# Patient Record
Sex: Male | Born: 1935 | Race: White | Hispanic: No | Marital: Married | Smoking: Never smoker
Health system: Southern US, Community
[De-identification: ages and names within clinical notes are randomized; demographics above are authoritative.]

## PROBLEM LIST (undated history)

## (undated) DIAGNOSIS — F329 Major depressive disorder, single episode, unspecified: Secondary | ICD-10-CM

## (undated) DIAGNOSIS — S68119A Complete traumatic metacarpophalangeal amputation of unspecified finger, initial encounter: Secondary | ICD-10-CM

## (undated) DIAGNOSIS — J329 Chronic sinusitis, unspecified: Secondary | ICD-10-CM

## (undated) DIAGNOSIS — K219 Gastro-esophageal reflux disease without esophagitis: Secondary | ICD-10-CM

## (undated) DIAGNOSIS — E785 Hyperlipidemia, unspecified: Secondary | ICD-10-CM

## (undated) DIAGNOSIS — F419 Anxiety disorder, unspecified: Secondary | ICD-10-CM

## (undated) DIAGNOSIS — M545 Low back pain, unspecified: Secondary | ICD-10-CM

## (undated) DIAGNOSIS — N529 Male erectile dysfunction, unspecified: Secondary | ICD-10-CM

## (undated) DIAGNOSIS — E119 Type 2 diabetes mellitus without complications: Secondary | ICD-10-CM

## (undated) DIAGNOSIS — K589 Irritable bowel syndrome without diarrhea: Secondary | ICD-10-CM

## (undated) DIAGNOSIS — F32A Depression, unspecified: Secondary | ICD-10-CM

## (undated) DIAGNOSIS — R5383 Other fatigue: Secondary | ICD-10-CM

## (undated) HISTORY — DX: Hyperlipidemia, unspecified: E78.5

## (undated) HISTORY — DX: Low back pain, unspecified: M54.50

## (undated) HISTORY — DX: Major depressive disorder, single episode, unspecified: F32.9

## (undated) HISTORY — DX: Irritable bowel syndrome, unspecified: K58.9

## (undated) HISTORY — DX: Type 2 diabetes mellitus without complications: E11.9

## (undated) HISTORY — DX: Depression, unspecified: F32.A

## (undated) HISTORY — DX: Chronic sinusitis, unspecified: J32.9

## (undated) HISTORY — DX: Complete traumatic metacarpophalangeal amputation of unspecified finger, initial encounter: S68.119A

## (undated) HISTORY — DX: Other fatigue: R53.83

## (undated) HISTORY — DX: Anxiety disorder, unspecified: F41.9

## (undated) HISTORY — DX: Male erectile dysfunction, unspecified: N52.9

## (undated) HISTORY — PX: NECK SURGERY: SHX720

## (undated) HISTORY — DX: Low back pain: M54.5

---

## 1997-10-22 ENCOUNTER — Emergency Department (HOSPITAL_COMMUNITY): Admission: EM | Admit: 1997-10-22 | Discharge: 1997-10-22 | Payer: Self-pay | Admitting: Emergency Medicine

## 1997-11-13 ENCOUNTER — Encounter: Admission: RE | Admit: 1997-11-13 | Discharge: 1998-02-11 | Payer: Self-pay | Admitting: Unknown Physician Specialty

## 1998-05-03 ENCOUNTER — Encounter: Payer: Self-pay | Admitting: *Deleted

## 1998-05-03 ENCOUNTER — Ambulatory Visit (HOSPITAL_COMMUNITY): Admission: RE | Admit: 1998-05-03 | Discharge: 1998-05-03 | Payer: Self-pay | Admitting: *Deleted

## 1998-07-06 ENCOUNTER — Encounter: Payer: Self-pay | Admitting: Emergency Medicine

## 1998-07-06 ENCOUNTER — Observation Stay (HOSPITAL_COMMUNITY): Admission: EM | Admit: 1998-07-06 | Discharge: 1998-07-07 | Payer: Self-pay | Admitting: Emergency Medicine

## 1998-07-26 ENCOUNTER — Ambulatory Visit (HOSPITAL_COMMUNITY): Admission: RE | Admit: 1998-07-26 | Discharge: 1998-07-26 | Payer: Self-pay | Admitting: Cardiovascular Disease

## 1998-09-04 ENCOUNTER — Emergency Department (HOSPITAL_COMMUNITY): Admission: EM | Admit: 1998-09-04 | Discharge: 1998-09-04 | Payer: Self-pay | Admitting: Emergency Medicine

## 1998-10-04 ENCOUNTER — Emergency Department (HOSPITAL_COMMUNITY): Admission: EM | Admit: 1998-10-04 | Discharge: 1998-10-04 | Payer: Self-pay | Admitting: Emergency Medicine

## 1998-10-05 ENCOUNTER — Inpatient Hospital Stay (HOSPITAL_COMMUNITY): Admission: EM | Admit: 1998-10-05 | Discharge: 1998-10-06 | Payer: Self-pay | Admitting: Emergency Medicine

## 1998-10-05 ENCOUNTER — Encounter: Payer: Self-pay | Admitting: Emergency Medicine

## 1998-10-05 ENCOUNTER — Encounter: Payer: Self-pay | Admitting: Urology

## 1999-09-04 ENCOUNTER — Ambulatory Visit (HOSPITAL_COMMUNITY): Admission: RE | Admit: 1999-09-04 | Discharge: 1999-09-04 | Payer: Self-pay | Admitting: Family Medicine

## 1999-09-04 ENCOUNTER — Encounter: Payer: Self-pay | Admitting: Family Medicine

## 2003-11-02 ENCOUNTER — Encounter: Admission: RE | Admit: 2003-11-02 | Discharge: 2003-11-02 | Payer: Self-pay | Admitting: Gastroenterology

## 2003-12-24 ENCOUNTER — Emergency Department (HOSPITAL_COMMUNITY): Admission: EM | Admit: 2003-12-24 | Discharge: 2003-12-24 | Payer: Self-pay | Admitting: *Deleted

## 2005-10-21 ENCOUNTER — Encounter: Payer: Self-pay | Admitting: Emergency Medicine

## 2005-12-25 ENCOUNTER — Encounter: Admission: RE | Admit: 2005-12-25 | Discharge: 2005-12-25 | Payer: Self-pay | Admitting: Gastroenterology

## 2006-01-01 ENCOUNTER — Ambulatory Visit (HOSPITAL_COMMUNITY): Admission: RE | Admit: 2006-01-01 | Discharge: 2006-01-01 | Payer: Self-pay | Admitting: Gastroenterology

## 2008-10-03 ENCOUNTER — Inpatient Hospital Stay (HOSPITAL_COMMUNITY): Admission: EM | Admit: 2008-10-03 | Discharge: 2008-10-04 | Payer: Self-pay | Admitting: Emergency Medicine

## 2010-10-01 LAB — URINE MICROSCOPIC-ADD ON

## 2010-10-01 LAB — CARDIAC PANEL(CRET KIN+CKTOT+MB+TROPI)
CK, MB: 4.7 ng/mL — ABNORMAL HIGH (ref 0.3–4.0)
CK, MB: 5.8 ng/mL — ABNORMAL HIGH (ref 0.3–4.0)
Relative Index: 1.8 (ref 0.0–2.5)
Total CK: 317 U/L — ABNORMAL HIGH (ref 7–232)
Troponin I: <0.01 ng/mL (ref 0.00–0.06)
Troponin I: <0.01 ng/mL (ref 0.00–0.06)

## 2010-10-01 LAB — URINALYSIS, ROUTINE W REFLEX MICROSCOPIC
Bilirubin Urine: NEGATIVE
Glucose, UA: NEGATIVE mg/dL
Hgb urine dipstick: NEGATIVE
Nitrite: NEGATIVE
Urobilinogen, UA: 0.2 mg/dL (ref 0.0–1.0)

## 2010-10-01 LAB — COMPREHENSIVE METABOLIC PANEL
Albumin: 3.6 g/dL (ref 3.5–5.2)
Alkaline Phosphatase: 61 U/L (ref 39–117)
BUN: 9 mg/dL (ref 6–23)
Chloride: 103 mEq/L (ref 96–112)
Creatinine, Ser: 0.79 mg/dL (ref 0.4–1.5)
GFR calc Af Amer: >60 mL/min (ref >60)
Glucose, Bld: 124 mg/dL — ABNORMAL HIGH (ref 70–99)
Sodium: 135 mEq/L (ref 135–145)

## 2010-10-01 LAB — TROPONIN I: Troponin I: <0.01 ng/mL (ref 0.00–0.06)

## 2010-10-01 LAB — CK TOTAL AND CKMB (NOT AT ARMC)
Relative Index: 3.9 — ABNORMAL HIGH (ref 0.0–2.5)
Total CK: 163 U/L (ref 7–232)

## 2010-10-01 LAB — LIPASE, BLOOD: Lipase: 20 U/L (ref 11–59)

## 2010-10-01 LAB — CBC
MCV: 93 fL (ref 78.0–100.0)
RBC: 5 MIL/uL (ref 4.22–5.81)
RDW: 13 % (ref 11.5–15.5)

## 2010-10-01 LAB — BRAIN NATRIURETIC PEPTIDE: Pro B Natriuretic peptide (BNP): 50 pg/mL (ref 0.0–100.0)

## 2010-10-27 ENCOUNTER — Emergency Department (HOSPITAL_COMMUNITY): Payer: Medicare HMO

## 2010-10-27 ENCOUNTER — Observation Stay (HOSPITAL_COMMUNITY)
Admission: EM | Admit: 2010-10-27 | Discharge: 2010-10-28 | Disposition: A | Discharge status: Home / Self Care | Payer: Medicare HMO | Source: Ambulatory Visit | Attending: Interventional Cardiology | Admitting: Interventional Cardiology

## 2010-10-27 DIAGNOSIS — E785 Hyperlipidemia, unspecified: Secondary | ICD-10-CM | POA: Insufficient documentation

## 2010-10-27 DIAGNOSIS — Z01812 Encounter for preprocedural laboratory examination: Secondary | ICD-10-CM | POA: Insufficient documentation

## 2010-10-27 DIAGNOSIS — R079 Chest pain, unspecified: Principal | ICD-10-CM | POA: Insufficient documentation

## 2010-10-27 DIAGNOSIS — Z01818 Encounter for other preprocedural examination: Secondary | ICD-10-CM | POA: Insufficient documentation

## 2010-10-27 DIAGNOSIS — Z79899 Other long term (current) drug therapy: Secondary | ICD-10-CM | POA: Insufficient documentation

## 2010-10-27 DIAGNOSIS — Z01810 Encounter for preprocedural cardiovascular examination: Secondary | ICD-10-CM | POA: Insufficient documentation

## 2010-10-27 LAB — BASIC METABOLIC PANEL
BUN: 17 mg/dL (ref 6–23)
Chloride: 101 mEq/L (ref 96–112)
GFR calc non Af Amer: >60 mL/min (ref >60)
Potassium: 3.6 mEq/L (ref 3.5–5.1)

## 2010-10-27 LAB — DIFFERENTIAL
Basophils Absolute: 0.1 10*3/uL (ref 0.0–0.1)
Basophils Relative: 1 % (ref 0–1)
Monocytes Absolute: 0.9 10*3/uL (ref 0.1–1.0)
Monocytes Relative: 9 % (ref 3–12)
Neutro Abs: 4.3 10*3/uL (ref 1.7–7.7)

## 2010-10-27 LAB — CBC
HCT: 46.8 % (ref 39.0–52.0)
MCV: 88.8 fL (ref 78.0–100.0)
Platelets: 229 10*3/uL (ref 150–400)
RBC: 5.27 MIL/uL (ref 4.22–5.81)
RDW: 13 % (ref 11.5–15.5)
WBC: 9.2 10*3/uL (ref 4.0–10.5)

## 2010-10-27 LAB — D-DIMER, QUANTITATIVE: D-Dimer, Quant: <0.22 ug/mL-FEU (ref 0.00–0.48)

## 2010-10-27 LAB — POCT CARDIAC MARKERS: Myoglobin, poc: 110 ng/mL (ref 12–200)

## 2010-10-27 LAB — TROPONIN I: Troponin I: <0.3 ng/mL (ref <0.30)

## 2010-10-27 LAB — CK TOTAL AND CKMB (NOT AT ARMC)
CK, MB: 4.1 ng/mL — ABNORMAL HIGH (ref 0.3–4.0)
Total CK: 104 U/L (ref 7–232)

## 2010-10-28 LAB — CARDIAC PANEL(CRET KIN+CKTOT+MB+TROPI)
CK, MB: 3.9 ng/mL (ref 0.3–4.0)
Total CK: 102 U/L (ref 7–232)

## 2010-10-28 LAB — BASIC METABOLIC PANEL
BUN: 15 mg/dL (ref 6–23)
CO2: 27 mEq/L (ref 19–32)
Calcium: 9 mg/dL (ref 8.4–10.5)
Creatinine, Ser: 0.65 mg/dL (ref 0.4–1.5)
GFR calc Af Amer: >60 mL/min (ref >60)
Glucose, Bld: 123 mg/dL — ABNORMAL HIGH (ref 70–99)

## 2010-10-28 LAB — CBC
Hemoglobin: 15.3 g/dL (ref 13.0–17.0)
MCH: 30.1 pg (ref 26.0–34.0)
MCHC: 33.5 g/dL (ref 30.0–36.0)

## 2010-10-30 NOTE — Discharge Summary (Signed)
  NAME:  Ivan Flores, Ivan Flores NO.:  [id number]  MEDICAL RECORD NO.:  [id number]           PATIENT TYPE:  I  LOCATION:  6527                         FACILITY:  [facility]  PHYSICIAN:  Lyn Records, M.D.   DATE OF BIRTH:  Jul 19, 1934  DATE OF ADMISSION:  10/27/2010 DATE OF DISCHARGE:  10/28/2010                              DISCHARGE SUMMARY   REASON FOR ADMISSION:  Left arm, neck, jaw, and upper chest pain.  DISCHARGE DIAGNOSES: 1. Constellation of jaw, neck, left arm, and upper left chest     discomfort of uncertain cause.  Acute coronary syndrome was     excluded and heart catheterization/coronary angiography was normal.     It is likely that the pain is inflammatory and/or musculoskeletal.     It is similar to a episode in 2010. 2. Hyperlipidemia. 3. Gastroesophageal reflux disease. 4. History of Schatzki ring.  PROCEDURES PERFORMED:  Urgent coronary angiography, Dr. Catalina Gravel Oct 27, 2010, which was normal.  DISCHARGE INSTRUCTIONS:  Follow up with primary physician, Dr. Laurann Montana.  He is instructed to call to make the appointment.  MEDICATIONS: 1. Hyoscyamine 0.125 mg under tongue every 6 hours as needed. 2. Lorazepam 0.5 mg three times a day as needed for anxiety. 3. Omeprazole 40 mg daily. 4. Pravastatin 40 mg daily. 5. Vicodin ES as needed for pain every 6 hours.  ACTIVITY:  No restrictions.  CONDITION ON DISCHARGE:  Improved.  FURTHER WORK UP:  We will leave this to the primary team, but may need to consider doing a CT or MR of the neck to rule out cervical disk disease as the cause of the patient's pain.  HISTORY AND PHYSICAL AND HOSPITAL COURSE:  Please see the admitting history and physical.  Because of the presenting symptom, cardiac markers were done serially as well as EKGs.  Because of concern that his symptoms could represent acute coronary syndrome, he underwent urgent catheterization by Dr. Eldridge Dace.  No evidence of coronary artery  disease was identified. Markers remained normal.  On the day following admission, he was felt to be stable and ready for discharge.  No further cardiac workup is planned at this time.     Lyn Records, M.D.     HWS/MEDQ  D:  10/28/2010  T:  10/28/2010  Job:  [id number]  cc:   Stacie Acres. Cliffton Asters, M.D.  Electronically Signed by Verdis Prime M.D. on 10/30/2010 12:27:12 PM

## 2010-10-31 ENCOUNTER — Other Ambulatory Visit: Payer: Self-pay | Admitting: Family Medicine

## 2010-10-31 DIAGNOSIS — M542 Cervicalgia: Secondary | ICD-10-CM

## 2010-10-31 DIAGNOSIS — R200 Anesthesia of skin: Secondary | ICD-10-CM

## 2010-11-05 ENCOUNTER — Ambulatory Visit
Admission: RE | Admit: 2010-11-05 | Discharge: 2010-11-05 | Disposition: A | Discharge status: Home / Self Care | Payer: Medicare HMO | Source: Ambulatory Visit | Attending: Family Medicine | Admitting: Family Medicine

## 2010-11-05 DIAGNOSIS — R200 Anesthesia of skin: Secondary | ICD-10-CM

## 2010-11-05 DIAGNOSIS — M542 Cervicalgia: Secondary | ICD-10-CM

## 2010-11-05 NOTE — Cardiovascular Report (Signed)
NAME:  Ivan Flores, BEWLEY NO.:  [id number]  MEDICAL RECORD NO.:  [id number]           PATIENT TYPE:  I  LOCATION:  6527                         FACILITY:  [facility]  PHYSICIAN:  Corky Crafts, MDDATE OF BIRTH:  Sep 22, 1934  DATE OF PROCEDURE:  10/27/2010 DATE OF DISCHARGE:                           CARDIAC CATHETERIZATION   PRIMARY CARE PHYSICIAN:  Stacie Acres. Cliffton Asters, MD  PRIMARY CARDIOLOGIST:  Lyn Records, MD  PROCEDURE PERFORMED:  Left heart catheterization, left ventriculogram, coronary angiogram.  OPERATOR:  Corky Crafts, MD  INDICATIONS:  Unstable angina.  PROCEDURE NARRATIVE:  The risks and benefits of cardiac catheterization were explained to the patient and informed consent was obtained.  He was brought to the cath lab.  He was prepped and draped in usual sterile fashion.  His right wrist was infiltrated with 1% lidocaine.  A 5-French glide sheath was placed into the right radial artery using modified Seldinger technique.  Left coronary artery angiography was performed using a JL-3.5 catheter.  The catheter was advanced to the vessel ostium under fluoroscopic guidance.  Digital angiography was performed in multiple projections using hand injection of contrast.  The right coronary artery angiography was performed using a Williams right catheter in a similar fashion.  A pigtail catheter was advanced to the ascending aorta under fluoroscopic guidance.  Power injection of contrast was performed in the RAO projection to image the left ventricle and catheter was pulled back under continuous hemodynamic pressure monitoring.  The sheath was then removed and a TR band was used for hemostasis.  FINDINGS:  The left main was a long vessel and appeared widely patent. Left circumflex was a large codominant vessel.  There are mild luminal irregularities.  The OM-1 is medium-sized and widely patent.  OM-2 and OM-3 are small but patent.  There does appear  to be a left PDA which appears widely patent. Left anterior descending is a large vessel which reaches the apex. __________ there is an ostial 25% plaque.  There are minor irregularities in the mid to distal LAD.  The first diagonal is a medium- sized vessel and widely patent.  The second diagonal is a small vessel and patent. The right coronary artery is a medium size codominant vessel.  There are mild luminal irregularities throughout. Left ventriculogram shows normal left ventricular function.  There is no ascending aortic aneurysm.  HEMODYNAMICS:  Left ventricular pressure 132/9 with an LVEDP of 13 mmHg. Aortic pressure 126/57 with a mean aortic pressure of 80 mmHg.  IMPRESSION: 1. No significant coronary artery disease, only mild luminal     irregularities noted. 2. Normal left ventricular function. 3. Normal hemodynamics.  RECOMMENDATIONS:  Continue preventive therapy.  We will investigate other etiologies of his chest pain.  We will send off a D-dimer.  From this test, it does not appear that he has any significant coronary artery disease or any significant ascending aortic pathology.  We will likely watch him overnight.     Corky Crafts, MD     JSV/MEDQ  D:  10/27/2010  T:  10/28/2010  Job:  [id number]  Electronically Signed by Lance Muss MD  on 11/05/2010 01:02:39 PM

## 2010-11-05 NOTE — H&P (Signed)
NAME:  MARIAH, HARN NO.:  [id number]  MEDICAL RECORD NO.:  [id number]           PATIENT TYPE:  I  LOCATION:  6527                         FACILITY:  [facility]  PHYSICIAN:  Corky Crafts, MDDATE OF BIRTH:  30-Mar-1935  DATE OF ADMISSION:  10/27/2010 DATE OF DISCHARGE:                             HISTORY & PHYSICAL   PRIMARY CARE PHYSICIAN:  Stacie Acres. White, MD  PRIMARY CARDIOLOGIST:  Lyn Records, MD  HISTORY OF PRESENT ILLNESS:  The patient is a 76 year old man who has severe shoulder, arm, and hand pain, starting about 8:30 in the morning, then moved to the left chest.  He had a headache.  He denies any nausea, vomiting, or syncope.  He has had some intermittent jaw pain.  He has not been very physically active in the past couple of months, prior to that, he had been doing a lot of yard work including chopping wood, he had no chest discomfort with that.  He does feel that he has been more tired lately.  His son recently had a stroke and is in rehab.  He reports back pain and some left arm pain.  Currently, his gums are hurting.  All the symptoms started before the nitroglycerin was started.  PAST MEDICAL HISTORY: 1. Schatzki ring. 2. GERD. 3. High cholesterol.  PAST SURGICAL HISTORY:  Back surgery, prior esophageal balloon dilatation.  ALLERGIES:  EPHEDRINE and intolerance to IBUPROFEN.  SOCIAL HISTORY:  He quit smoking 5-6 years ago.  He is retired.  He occasionally drinks alcohol.  He does drink coffee daily.  FAMILY HISTORY:  His father had bypass surgery in his 60s.  HOME MEDICATIONS: 1. Vicodin as needed. 2. Pravastatin 80 mg daily. 3. Lorazepam. 4. Hyoscyamine. 5. Omeprazole 40 mg daily.  REVIEW OF SYSTEMS:  Significant for pain in his right index finger.  He wears a right wrist brace because he thinks he may have some carpal tunnel.  He has a lot of sensitivity in his right hand and wrist.  No bleeding problems.  No focal  weakness.  No rash.  Chest discomfort as noted above.  All other systems are negative.  PHYSICAL EXAMINATION:  VITAL SIGNS:  Blood pressure 119/60, pulse 45. GENERAL:  He is awake and alert. HEENT:  Head, normocephalic, atraumatic.  Eyes, extraocular movements intact. NECK:  No JVD.  No bruits. CARDIOVASCULAR:  Regular rate and rhythm.  S1 and S2. LUNGS:  Clear to auscultation bilaterally. ABDOMEN:  Soft, nontender. EXTREMITIES:  Showed 3+ radial pulses bilaterally.  EKG shows normal sinus rhythm and left axis deviation, no ST-segment changes.  Troponin is negative.  Hemoglobin 16.5.  Creatinine 0.82. Cardiolite in April 2010 showed no ischemia with an ejection fraction of 58%.  ASSESSMENT AND PLAN: 1. Some features worrisome for unstable angina in a 76 year old man     with high cholesterol.  He says that he had a cardiac cath several     years ago that showed that he grew a new vein in his heart because     he had a blockage.  I do not have any records of this.  He says  this was done many years ago.  We will start aspirin and heparin.     Risks and benefits of the cardiac cath were explained to the     patient and he is willing to proceed. 2. Pravastatin for hyperlipidemia.Corky Crafts, MD     JSV/MEDQ  D:  10/27/2010  T:  10/28/2010  Job:  [id number]  Electronically Signed by Lance Muss MD on 11/05/2010 01:02:30 PM

## 2010-11-07 NOTE — Discharge Summary (Signed)
NAME:  CANDACE, RAMUS NO.:  [id number]   MEDICAL RECORD NO.:  [id number]          PATIENT TYPE:  INP   LOCATION:  3743                         FACILITY:  [facility]   PHYSICIAN:  Lyn Records, M.D.   DATE OF BIRTH:  [birth year]   DATE OF ADMISSION:  10/03/2008  DATE OF DISCHARGE:  10/04/2008                               DISCHARGE SUMMARY   DISCHARGE DIAGNOSES:  1. Nausea, weakness, arm pain felt to be noncardiac in nature.  2. Depression.  3. Gastroesophageal reflux disease.  4. History of kidney stones.  5. Lumbar surgery.  6. Known coronary artery disease in his family.   HOSPITAL COURSE:  Armstead Heiland is a 76 year old male patient who  was admitted to [facility] with a sudden onset of nausea and  weakness, followed by left arm aching and jaw discomfort.  He was  admitted to the hospital and cardiac enzymes were negative.  We  performed an adenosine Cardiolite, showed no perfusion defects were  present.  At this point, we felt that it was safe for the patient to go  home and did not feel that it was cardiac in nature.   MEDICATIONS:  BuSpar daily, Prevacid daily, multivitamin daily, Flonase  as needed, ibuprofen p.r.n.   The patient was discharged home in stable, but improved condition.  Remain on a low-sodium heart-healthy diet.  Increase activity slowly.  Follow up with Dr. Dellia Cloud, nurse practitioner on October 18, 2008 at 1 p.m.      Guy Franco, P.A.      Lyn Records, M.D.  Electronically Signed    LB/MEDQ  D:  12/13/2008  T:  12/14/2008  Job:  [id number]   cc:   Stacie Acres. Cliffton Asters, M.D.

## 2010-11-07 NOTE — Op Note (Signed)
NAME:  Ivan Flores, Ivan Flores NO.:  [id number]   MEDICAL RECORD NO.:  [id number]          PATIENT TYPE:  AMB   LOCATION:  ENDO                         FACILITY:  [facility]   PHYSICIAN:  Graylin Shiver, M.D.   DATE OF BIRTH:  [birth year]   DATE OF PROCEDURE:  01/01/2006  DATE OF DISCHARGE:  01/01/2006                                 OPERATIVE REPORT   PROCEDURE:  Esophagogastroduodenoscopy with endoscopic balloon dilatation.   INDICATIONS FOR PROCEDURE:  Dysphagia, Schatzki's ring.   Informed consent was obtained after explanation of the risks of bleeding,  infection and perforation.   PREMEDICATION:  Fentanyl 50 mcg IV, Versed 4 mg IV.   PROCEDURE:  With the patient in the left lateral decubitus position, the  Olympus gastroscope was inserted into the oropharynx and passed into the  esophagus.  It was advanced down the esophagus and into the stomach and into  the duodenum.  The second portion and bulb of the duodenum were normal.  The  stomach had normal-appearing mucosa.  There was a small hiatal hernia.  There was a Schatzki's ring noted in the distal esophagus.  An endoscopic  balloon dilator was advanced down the scope.  I inflated the balloon to 15,  then 16.5, then 18 mm.  The balloon was held in place at each level for 1  minute.  The balloon was then deflated.  He tolerated the procedure well  without complications.  There was some heme on the site of the dilation.  The rest of the esophagus looked normal.   IMPRESSION:  Schatzki's ring causing dysphagia.   PLAN:  Observe response to the dilatation.           ______________________________  Graylin Shiver, M.D.     SFG/MEDQ  D:  01/04/2006  T:  01/05/2006  Job:  [id number]

## 2010-12-04 ENCOUNTER — Encounter (HOSPITAL_COMMUNITY)
Admission: RE | Admit: 2010-12-04 | Discharge: 2010-12-04 | Disposition: A | Discharge status: Home / Self Care | Payer: Medicare HMO | Source: Ambulatory Visit | Attending: Neurological Surgery | Admitting: Neurological Surgery

## 2010-12-04 LAB — CBC
MCHC: 36 g/dL (ref 30.0–36.0)
Platelets: 218 10*3/uL (ref 150–400)
RDW: 13 % (ref 11.5–15.5)

## 2010-12-04 LAB — BASIC METABOLIC PANEL
GFR calc Af Amer: >60 mL/min (ref >60)
GFR calc non Af Amer: >60 mL/min (ref >60)
Potassium: 5 mEq/L (ref 3.5–5.1)
Sodium: 138 mEq/L (ref 135–145)

## 2010-12-04 LAB — PROTIME-INR: INR: 0.96 (ref 0.00–1.49)

## 2010-12-04 LAB — DIFFERENTIAL
Basophils Absolute: 0.1 10*3/uL (ref 0.0–0.1)
Basophils Relative: 1 % (ref 0–1)
Eosinophils Absolute: 0.2 10*3/uL (ref 0.0–0.7)
Eosinophils Relative: 3 % (ref 0–5)
Monocytes Absolute: 0.7 10*3/uL (ref 0.1–1.0)
Neutro Abs: 4 10*3/uL (ref 1.7–7.7)

## 2010-12-04 LAB — APTT: aPTT: 33 seconds (ref 24–37)

## 2010-12-09 ENCOUNTER — Inpatient Hospital Stay (HOSPITAL_COMMUNITY)
Admission: RE | Admit: 2010-12-09 | Discharge: 2010-12-10 | DRG: 473 | Disposition: A | Discharge status: Home / Self Care | Payer: Medicare HMO | Source: Ambulatory Visit | Attending: Neurological Surgery | Admitting: Neurological Surgery

## 2010-12-09 ENCOUNTER — Ambulatory Visit (HOSPITAL_COMMUNITY): Payer: Medicare HMO

## 2010-12-09 DIAGNOSIS — F329 Major depressive disorder, single episode, unspecified: Secondary | ICD-10-CM | POA: Diagnosis present

## 2010-12-09 DIAGNOSIS — Z01812 Encounter for preprocedural laboratory examination: Secondary | ICD-10-CM

## 2010-12-09 DIAGNOSIS — M1990 Unspecified osteoarthritis, unspecified site: Secondary | ICD-10-CM | POA: Diagnosis present

## 2010-12-09 DIAGNOSIS — M4712 Other spondylosis with myelopathy, cervical region: Principal | ICD-10-CM | POA: Diagnosis present

## 2010-12-09 DIAGNOSIS — K219 Gastro-esophageal reflux disease without esophagitis: Secondary | ICD-10-CM | POA: Diagnosis present

## 2010-12-09 DIAGNOSIS — F3289 Other specified depressive episodes: Secondary | ICD-10-CM | POA: Diagnosis present

## 2010-12-15 NOTE — Op Note (Signed)
NAME:  Ivan Flores, MOHS NO.:  [id number]  MEDICAL RECORD NO.:  [id number]  LOCATION:  3535                         FACILITY:  [facility]  PHYSICIAN:  Tia Alert, MD     DATE OF BIRTH:  1934/07/30  DATE OF PROCEDURE:  12/09/2010 DATE OF DISCHARGE:                              OPERATIVE REPORT   PREOPERATIVE DIAGNOSIS:  Cervical spinal stenosis and cervical spondylosis, C5-6, C6-7, with early myelopathy and left arm pain.  POSTOPERATIVE DIAGNOSIS:  Cervical spinal stenosis and cervical spondylosis, C5-6, C6-7, with early myelopathy and left arm pain.  PROCEDURE: 1. Decompressive anterior cervical diskectomy, C5-6, C6-C7. 2. Anterior cervical arthrodesis, C5-6, C6-7, utilizing 9-mm PEEK     interbody cages, packed with local autograft and Actifuse putty. 3. Anterior cervical plating, C5-C7, utilizing the DePuy skyline     plate.  SURGEON:  Tia Alert, MD.  ASSISTANT:  Donalee Citrin, MD.  ANESTHESIA:  General endotracheal.  COMPLICATIONS:  None apparent.  INDICATIONS FOR PROCEDURE:  Ivan Flores is a very pleasant 76 year old gentleman who presented with neck pain, radiated into the left arm.  He had some numbness and tingling in the right hand.  He felt like his gait was a little changed.  He had an MRI, which signal change within the spinal cord at C5-6 with stenosis at that level and cord compression. He had right foraminal stenosis at C5-6 and let foraminal stenosis at C6- 7.  He complained of significant left arm pain.  I recommended 2-level ACDF with plating at C5-6 and C6-7 in hopes of improving her pain syndrome.  He understood the risks, the benefits, expected outcome, and wished to proceed.  DESCRIPTION OF PROCEDURE:  The patient was taken to operating room. After induction of adequate generalized endotracheal anesthesia, he was placed in supine position on the operating table.  His right anterior cervical region was prepped with DuraPrep and  draped in usual sterile fashion.  A 5 mL of local anesthesia was injected and a transverse incision was made to the right of midline and carried down to the platysma, which was elevated, opened, and undermined with Metzenbaum scissors.  I then dissected a plane medial to the sternocleidomastoid muscle and internal carotid artery and lateral to the trachea and esophagus to expose C5-6 and C6-7.  Intraoperative fluoroscopy was confirmed my level and then the longus colli muscles were taken down and the shadow line retractors were placed under this to expose C5-6 and C6- 7.  Anterior osteophytes were removed.  The annulus was incised.  The diskectomies were done with pituitary rongeurs and curved curettes.  I then used the high-speed drill to drill the endplates down to the level of the posterior spurs and posterior longitudinal ligament.  The drill shavings were saved in a mucous trap for later arthrodesis.  I brought in the operating microscope and performed the exact same decompression at both levels.  I opened the posterior longitudinal ligament with a nerve hook and then removed, undercutting the vertebral bodies at C5-C6 and C6-C7 by angling the scope up and down.  I was able to undercut the vertebral bodies until the dura was relaxed.  I performed bilateral foraminotomies at C5-6 and  left foraminotomy at C6-7, identified the nerve roots.  I marched until I was distal to the pedicle at each level. The nerve hook were then passed easily along the nerve root until I was distal to the pedicle and also passed easily in the midline.  Once meticulous decompression was complete, I irrigated with saline solution, I dried the surgical bed with Surgifoam and Gelfoam.  I measured interspaces to be 9 mm.  I used corresponding PEEK interbody cages, packed with local autograft and Actifuse putty and tapped these into position at C5-6, C6-7, and used a DePuy spine skyline plate.  I placed two 14-mm  variable angle screws in the bodies of C5, C6, and C7, and then locked these into position.  I then further dried the surgical bed with Gelfoam, Surgifoam, and bipolar cautery.  Once meticulous hemostasis was achieved, I closed the subcuticular tissue with 3-0 Vicryl and closed the skin with Benzoin Steri-Strips.  The drapes were removed.  Sterile dressing was applied.  The patient awakened from general anesthesia and transferred to recovery room in stable condition. At the end of procedure, all sponge, needle, and instrument counts were correct.     Tia Alert, MD     DSJ/MEDQ  D:  12/09/2010  T:  12/10/2010  Job:  [id number]  Electronically Signed by Marikay Alar MD on 12/15/2010 05:47:14 AM

## 2010-12-23 ENCOUNTER — Other Ambulatory Visit: Payer: Self-pay | Admitting: Neurological Surgery

## 2010-12-23 ENCOUNTER — Ambulatory Visit
Admission: RE | Admit: 2010-12-23 | Discharge: 2010-12-23 | Disposition: A | Discharge status: Home / Self Care | Payer: Medicare HMO | Source: Ambulatory Visit | Attending: Neurological Surgery | Admitting: Neurological Surgery

## 2010-12-23 DIAGNOSIS — M4802 Spinal stenosis, cervical region: Secondary | ICD-10-CM

## 2010-12-23 DIAGNOSIS — M79609 Pain in unspecified limb: Secondary | ICD-10-CM

## 2011-01-06 ENCOUNTER — Other Ambulatory Visit: Payer: Self-pay | Admitting: Neurological Surgery

## 2011-01-06 ENCOUNTER — Ambulatory Visit
Admission: RE | Admit: 2011-01-06 | Discharge: 2011-01-06 | Disposition: A | Discharge status: Home / Self Care | Payer: Medicare HMO | Source: Ambulatory Visit | Attending: Neurological Surgery | Admitting: Neurological Surgery

## 2011-01-06 DIAGNOSIS — M79609 Pain in unspecified limb: Secondary | ICD-10-CM

## 2011-01-06 DIAGNOSIS — M542 Cervicalgia: Secondary | ICD-10-CM

## 2011-01-06 DIAGNOSIS — M4712 Other spondylosis with myelopathy, cervical region: Secondary | ICD-10-CM

## 2011-01-06 DIAGNOSIS — M4802 Spinal stenosis, cervical region: Secondary | ICD-10-CM

## 2011-04-13 ENCOUNTER — Other Ambulatory Visit: Payer: Self-pay | Admitting: Neurological Surgery

## 2011-04-13 ENCOUNTER — Ambulatory Visit
Admission: RE | Admit: 2011-04-13 | Discharge: 2011-04-13 | Disposition: A | Discharge status: Home / Self Care | Payer: Medicare HMO | Source: Ambulatory Visit | Attending: Neurological Surgery | Admitting: Neurological Surgery

## 2011-04-13 DIAGNOSIS — M542 Cervicalgia: Secondary | ICD-10-CM

## 2011-04-13 DIAGNOSIS — M4802 Spinal stenosis, cervical region: Secondary | ICD-10-CM

## 2011-04-13 DIAGNOSIS — M4712 Other spondylosis with myelopathy, cervical region: Secondary | ICD-10-CM

## 2011-07-13 ENCOUNTER — Other Ambulatory Visit: Payer: Self-pay | Admitting: Neurological Surgery

## 2011-07-13 ENCOUNTER — Ambulatory Visit
Admission: RE | Admit: 2011-07-13 | Discharge: 2011-07-13 | Disposition: A | Discharge status: Home / Self Care | Payer: Medicare HMO | Source: Ambulatory Visit | Attending: Neurological Surgery | Admitting: Neurological Surgery

## 2011-07-13 DIAGNOSIS — M542 Cervicalgia: Secondary | ICD-10-CM

## 2012-04-13 IMAGING — CR DG CERVICAL SPINE 2 OR 3 VIEWS
2 series · 2 of 2 positions shown · non-contrast
Comparison: December 09, 2010

CLINICAL DATA: Cervical surgery 2 weeks ago; pain between shoulders

CERVICAL SPINE - 2-3 VIEW

[w c-spine lat]
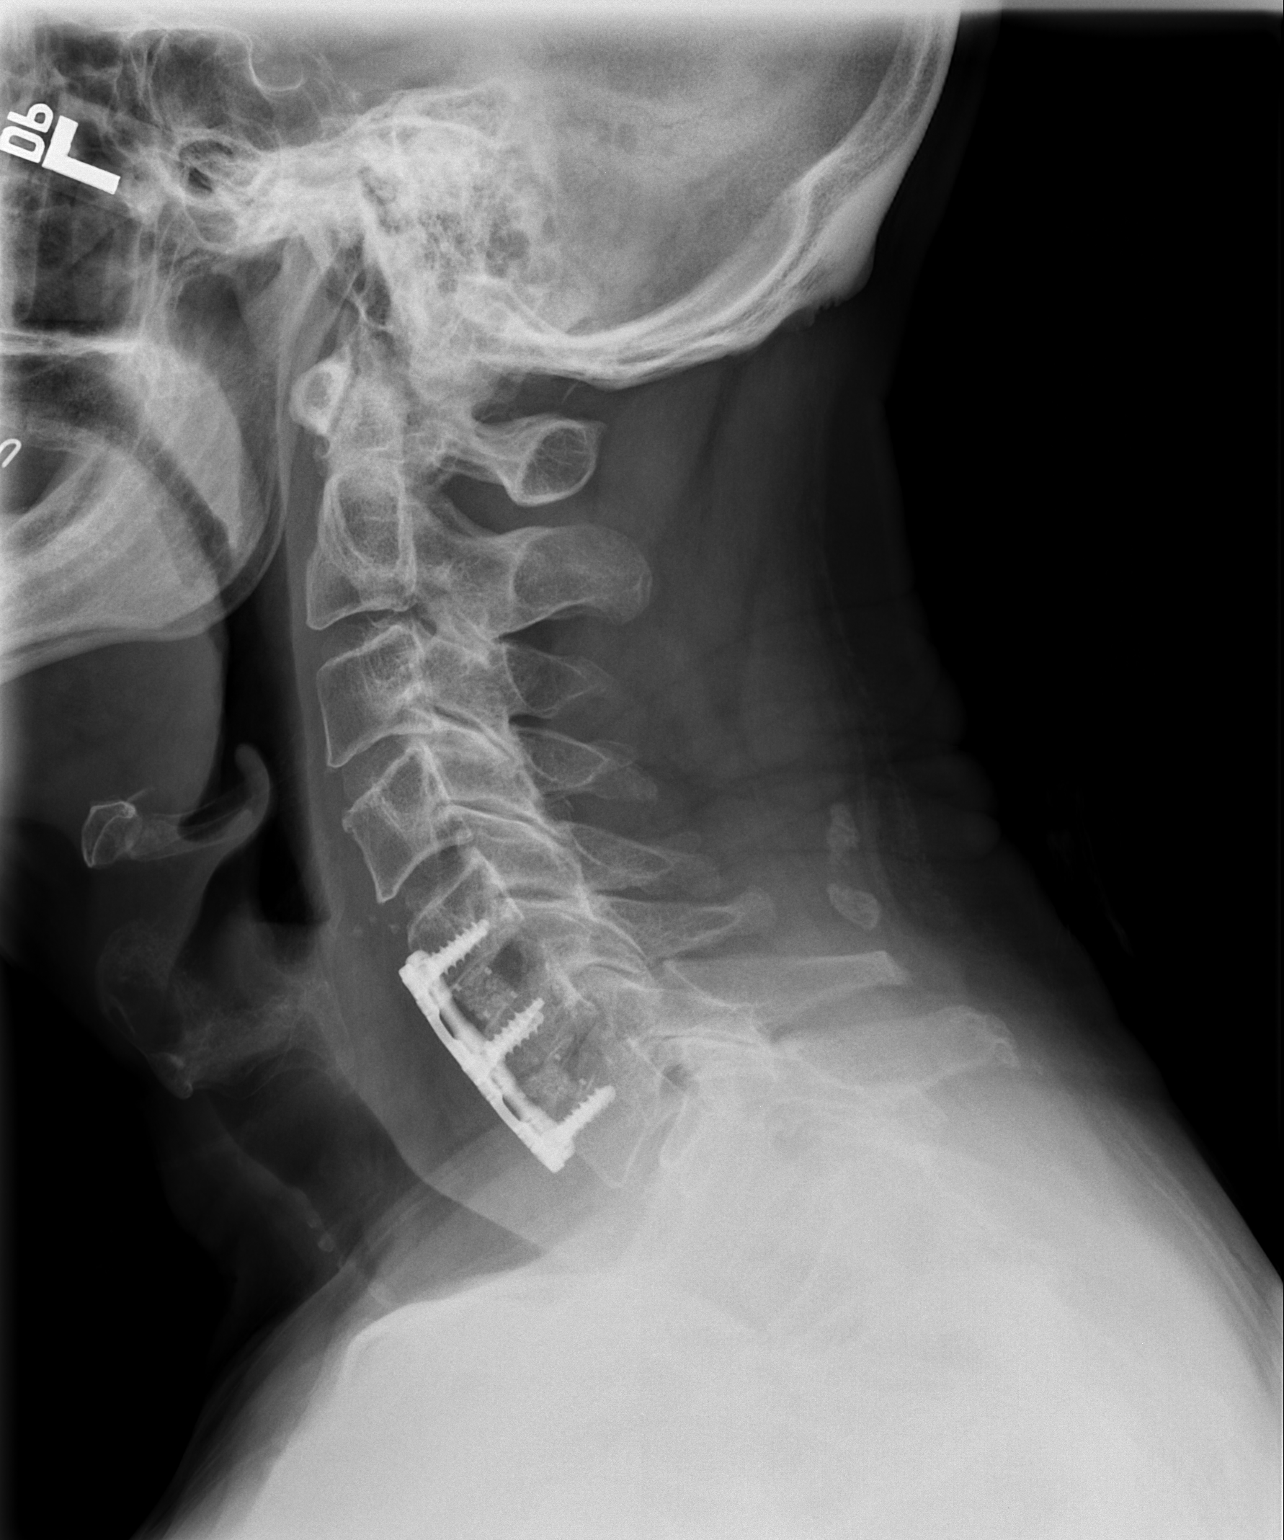

[w c-spine a.p. *]
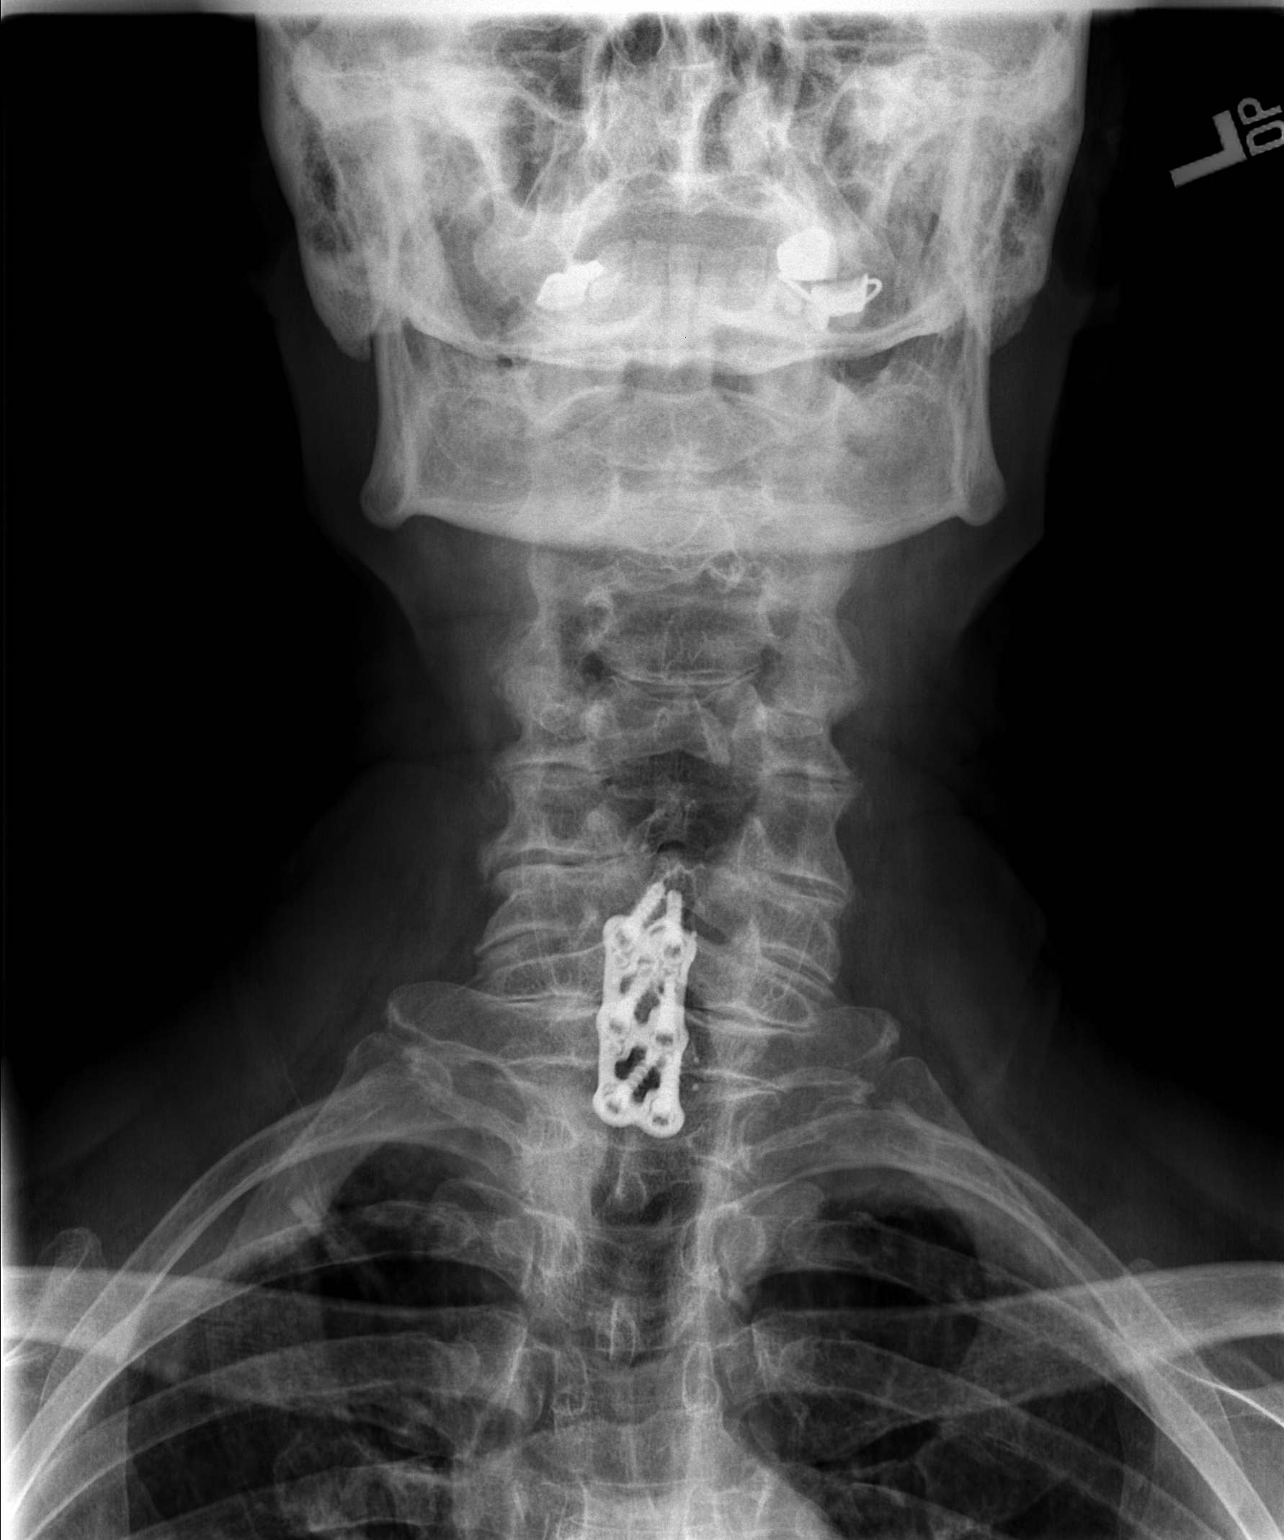

[2 of 2 positions shown; findings below may reference images not displayed]

FINDINGS: There is anterior fusion of C5-C7 with normal lateral
alignment and no peri hardware lucency.  The disc spaces are
maintained.
IMPRESSION: Postoperative changes at C5-C7 with normal lateral alignment and
preservation of the disc spaces.  No evidence of hardware
complication.

## 2012-08-19 ENCOUNTER — Encounter (HOSPITAL_COMMUNITY): Payer: Self-pay | Admitting: Emergency Medicine

## 2012-08-19 ENCOUNTER — Emergency Department (HOSPITAL_COMMUNITY)
Admission: EM | Admit: 2012-08-19 | Discharge: 2012-08-19 | Disposition: A | Discharge status: Home / Self Care | Payer: Medicare HMO | Attending: Emergency Medicine | Admitting: Emergency Medicine

## 2012-08-19 ENCOUNTER — Emergency Department (HOSPITAL_COMMUNITY): Payer: Medicare HMO

## 2012-08-19 DIAGNOSIS — W312XXA Contact with powered woodworking and forming machines, initial encounter: Secondary | ICD-10-CM | POA: Insufficient documentation

## 2012-08-19 DIAGNOSIS — Y929 Unspecified place or not applicable: Secondary | ICD-10-CM | POA: Insufficient documentation

## 2012-08-19 DIAGNOSIS — Z79899 Other long term (current) drug therapy: Secondary | ICD-10-CM | POA: Insufficient documentation

## 2012-08-19 DIAGNOSIS — Y939 Activity, unspecified: Secondary | ICD-10-CM | POA: Insufficient documentation

## 2012-08-19 DIAGNOSIS — Z23 Encounter for immunization: Secondary | ICD-10-CM | POA: Insufficient documentation

## 2012-08-19 DIAGNOSIS — K219 Gastro-esophageal reflux disease without esophagitis: Secondary | ICD-10-CM | POA: Insufficient documentation

## 2012-08-19 DIAGNOSIS — IMO0002 Reserved for concepts with insufficient information to code with codable children: Secondary | ICD-10-CM | POA: Insufficient documentation

## 2012-08-19 HISTORY — DX: Gastro-esophageal reflux disease without esophagitis: K21.9

## 2012-08-19 MED ORDER — CEPHALEXIN 500 MG PO CAPS: 500.0000 mg | ORAL_CAPSULE | Freq: Four times a day (QID) | ORAL | Status: DC

## 2012-08-19 MED ORDER — HYDROMORPHONE HCL PF 1 MG/ML IJ SOLN
1.0000 mg | Freq: Once | INTRAMUSCULAR | Status: AC
  Administered 2012-08-19: 1 mg via INTRAMUSCULAR
  Filled 2012-08-19: qty 1

## 2012-08-19 MED ORDER — HYDROCODONE-ACETAMINOPHEN 5-325 MG PO TABS
1.0000 | ORAL_TABLET | Freq: Once | ORAL | Status: AC
  Administered 2012-08-19: 1 via ORAL
  Filled 2012-08-19: qty 1

## 2012-08-19 MED ORDER — CEPHALEXIN 250 MG PO CAPS
500.0000 mg | ORAL_CAPSULE | Freq: Once | ORAL | Status: AC
  Administered 2012-08-19: 500 mg via ORAL
  Filled 2012-08-19: qty 2

## 2012-08-19 MED ORDER — HYDROCODONE-ACETAMINOPHEN 5-325 MG PO TABS: 1.0000 | ORAL_TABLET | Freq: Three times a day (TID) | ORAL | Status: DC | PRN

## 2012-08-19 MED ORDER — TETANUS-DIPHTH-ACELL PERTUSSIS 5-2.5-18.5 LF-MCG/0.5 IM SUSP
0.5000 mL | Freq: Once | INTRAMUSCULAR | Status: AC
  Administered 2012-08-19: 0.5 mL via INTRAMUSCULAR
  Filled 2012-08-19: qty 0.5

## 2012-08-19 NOTE — ED Notes (Signed)
Pt with right index finger tip amputation today with wood splitter; bleeding controlled

## 2012-08-19 NOTE — ED Notes (Signed)
Dressing changed to right index finger. Petroleum jelly gauze applied, 4x4 gauze wrapped around finger and cling wrapped around index finger and 2nd finger together. Per Dr. Jeraldine Loots.

## 2012-08-19 NOTE — ED Provider Notes (Signed)
History     CSN: [id number]  Arrival date & time 08/19/12  1655   First MD Initiated Contact with Patient 08/19/12 1710      Chief Complaint  Patient presents with  . Hand Injury    (Consider location/radiation/quality/duration/timing/severity/associated sxs/prior treatment) HPI The patient presents immediately after sustaining an injury to his right index finger.  He was using a wood splitter, mechanical.  The device avulsed the distal end of his index finger on the right.  No other injuries.  Since the event there has been pain persistently in the area. No attempts at relief thus far. The patient does not know his tetanus status.  No  Past Medical History  Diagnosis Date  . Acid reflux     History reviewed. No pertinent past surgical history.  History reviewed. No pertinent family history.  History  Substance Use Topics  . Smoking status: Never Smoker   . Smokeless tobacco: Not on file  . Alcohol Use: No      Review of Systems  All other systems reviewed and are negative.    Allergies  Pseudoephedrine  Home Medications   Current Outpatient Rx  Name  Route  Sig  Dispense  Refill  . cetirizine (ZYRTEC) 10 MG tablet   Oral   Take 10 mg by mouth daily.         . fluticasone (FLONASE) 50 MCG/ACT nasal spray   Nasal   Place 2 sprays into the nose daily.         Marland Kitchen ibuprofen (ADVIL,MOTRIN) 200 MG tablet   Oral   Take 600 mg by mouth every 6 (six) hours as needed for pain.         . ranitidine (ZANTAC) 150 MG tablet   Oral   Take 150 mg by mouth 2 (two) times daily.           BP 154/81  Pulse 79  Temp(Src) 98.1 F (36.7 C) (Oral)  Resp 18  SpO2 97%  Physical Exam  Nursing note and vitals reviewed. Constitutional: He appears well-developed and well-nourished.  HENT:  Head: Normocephalic and atraumatic.  Eyes: Conjunctivae are normal. Right eye exhibits no discharge. Left eye exhibits no discharge.  Neck: No tracheal deviation present.    Cardiovascular: Normal rate, regular rhythm and intact distal pulses.   Pulmonary/Chest: Effort normal. No stridor. No respiratory distress.  Musculoskeletal:       Arms: Skin: He is not diaphoretic.    ED Course  Wound repair Date/Time: 08/19/2012 7:16 PM Performed by: Gerhard Munch Authorized by: Gerhard Munch Consent: The procedure was performed in an emergent situation. Risks and benefits: risks, benefits and alternatives were discussed Consent given by: patient Patient understanding: patient states understanding of the procedure being performed Patient consent: the patient's understanding of the procedure matches consent given Procedure consent: procedure consent matches procedure scheduled Relevant documents: relevant documents present and verified Test results: test results available and properly labeled Site marked: the operative site was marked Imaging studies: imaging studies available Required items: required blood products, implants, devices, and special equipment available Patient identity confirmed: verbally with patient and hospital-assigned identification number Time out: Immediately prior to procedure a "time out" was called to verify the correct patient, procedure, equipment, support staff and site/side marked as required. Preparation: Patient was prepped and draped in the usual sterile fashion. Local anesthesia used: no Patient sedated: no Patient tolerance: Patient tolerated the procedure well with no immediate complications. Comments: Indication of the distal index finger  on the right hand, dressed with petroleum based gauze, following irrigation, cleansing.   (including critical care time)  Labs Reviewed - No data to display No results found.   No diagnosis found.  After the initial evaluation I discussed the patient's case with our orthopedist.  The patient we discharged with antibiotics, followup in 3 days.  MDM  This previously well male  presents after the accidental amputation of the distal index finger on the right hand.  The patient is otherwise in good health.  The tissues nonviable.  There is no exposed bone.  After discussion with the orthopedist on call, the patient was discharged to follow up with him in 3 days.  Antibiotics, analgesics provided.  Tetanus updated.      Gerhard Munch, MD 08/19/12 [phone]

## 2012-09-22 ENCOUNTER — Other Ambulatory Visit: Payer: Self-pay | Admitting: Family Medicine

## 2012-09-22 ENCOUNTER — Ambulatory Visit
Admission: RE | Admit: 2012-09-22 | Discharge: 2012-09-22 | Disposition: A | Discharge status: Home / Self Care | Payer: Medicare HMO | Source: Ambulatory Visit | Attending: Family Medicine | Admitting: Family Medicine

## 2012-09-22 DIAGNOSIS — R109 Unspecified abdominal pain: Secondary | ICD-10-CM

## 2012-09-22 DIAGNOSIS — R1032 Left lower quadrant pain: Secondary | ICD-10-CM

## 2013-02-22 ENCOUNTER — Ambulatory Visit
Admission: RE | Admit: 2013-02-22 | Discharge: 2013-02-22 | Disposition: A | Discharge status: Home / Self Care | Payer: Medicare HMO | Source: Ambulatory Visit | Attending: Family Medicine | Admitting: Family Medicine

## 2013-02-22 ENCOUNTER — Other Ambulatory Visit: Payer: Self-pay | Admitting: Family Medicine

## 2013-02-22 DIAGNOSIS — R1310 Dysphagia, unspecified: Secondary | ICD-10-CM

## 2014-11-01 DIAGNOSIS — E1165 Type 2 diabetes mellitus with hyperglycemia: Secondary | ICD-10-CM | POA: Diagnosis not present

## 2014-11-01 DIAGNOSIS — E119 Type 2 diabetes mellitus without complications: Secondary | ICD-10-CM | POA: Diagnosis not present

## 2014-11-01 DIAGNOSIS — R1310 Dysphagia, unspecified: Secondary | ICD-10-CM | POA: Diagnosis not present

## 2014-11-01 DIAGNOSIS — E785 Hyperlipidemia, unspecified: Secondary | ICD-10-CM | POA: Diagnosis not present

## 2014-11-01 DIAGNOSIS — R350 Frequency of micturition: Secondary | ICD-10-CM | POA: Diagnosis not present

## 2014-11-01 DIAGNOSIS — R5383 Other fatigue: Secondary | ICD-10-CM | POA: Diagnosis not present

## 2014-11-01 DIAGNOSIS — K219 Gastro-esophageal reflux disease without esophagitis: Secondary | ICD-10-CM | POA: Diagnosis not present

## 2014-11-01 DIAGNOSIS — R634 Abnormal weight loss: Secondary | ICD-10-CM | POA: Diagnosis not present

## 2014-11-02 DIAGNOSIS — R1310 Dysphagia, unspecified: Secondary | ICD-10-CM | POA: Diagnosis not present

## 2014-11-09 DIAGNOSIS — K580 Irritable bowel syndrome with diarrhea: Secondary | ICD-10-CM | POA: Diagnosis not present

## 2014-11-09 DIAGNOSIS — E1165 Type 2 diabetes mellitus with hyperglycemia: Secondary | ICD-10-CM | POA: Diagnosis not present

## 2014-11-09 DIAGNOSIS — E785 Hyperlipidemia, unspecified: Secondary | ICD-10-CM | POA: Diagnosis not present

## 2014-11-09 DIAGNOSIS — N481 Balanitis: Secondary | ICD-10-CM | POA: Diagnosis not present

## 2014-11-09 DIAGNOSIS — E119 Type 2 diabetes mellitus without complications: Secondary | ICD-10-CM | POA: Diagnosis not present

## 2014-11-09 DIAGNOSIS — B356 Tinea cruris: Secondary | ICD-10-CM | POA: Diagnosis not present

## 2014-11-09 DIAGNOSIS — R109 Unspecified abdominal pain: Secondary | ICD-10-CM | POA: Diagnosis not present

## 2014-11-13 ENCOUNTER — Encounter: Payer: Commercial Managed Care - HMO | Attending: Family Medicine

## 2014-11-13 VITALS — Ht 72.0 in | Wt 200.3 lb

## 2014-11-13 DIAGNOSIS — Z713 Dietary counseling and surveillance: Secondary | ICD-10-CM | POA: Insufficient documentation

## 2014-11-13 DIAGNOSIS — E119 Type 2 diabetes mellitus without complications: Secondary | ICD-10-CM | POA: Diagnosis not present

## 2014-11-13 NOTE — Progress Notes (Signed)

## 2014-11-20 ENCOUNTER — Ambulatory Visit: Payer: Medicare HMO

## 2014-11-20 DIAGNOSIS — K222 Esophageal obstruction: Secondary | ICD-10-CM | POA: Diagnosis not present

## 2014-11-20 DIAGNOSIS — R1310 Dysphagia, unspecified: Secondary | ICD-10-CM | POA: Diagnosis not present

## 2014-11-27 ENCOUNTER — Ambulatory Visit: Payer: Medicare HMO

## 2014-11-29 DIAGNOSIS — E1165 Type 2 diabetes mellitus with hyperglycemia: Secondary | ICD-10-CM | POA: Diagnosis not present

## 2014-11-29 DIAGNOSIS — E785 Hyperlipidemia, unspecified: Secondary | ICD-10-CM | POA: Diagnosis not present

## 2014-11-29 DIAGNOSIS — E119 Type 2 diabetes mellitus without complications: Secondary | ICD-10-CM | POA: Diagnosis not present

## 2014-11-29 DIAGNOSIS — M2662 Arthralgia of temporomandibular joint: Secondary | ICD-10-CM | POA: Diagnosis not present

## 2014-11-29 DIAGNOSIS — Z23 Encounter for immunization: Secondary | ICD-10-CM | POA: Diagnosis not present

## 2014-12-11 ENCOUNTER — Encounter: Payer: Commercial Managed Care - HMO | Attending: Family Medicine

## 2014-12-11 ENCOUNTER — Ambulatory Visit: Payer: Medicare HMO

## 2014-12-11 DIAGNOSIS — E119 Type 2 diabetes mellitus without complications: Secondary | ICD-10-CM | POA: Diagnosis not present

## 2014-12-11 DIAGNOSIS — Z713 Dietary counseling and surveillance: Secondary | ICD-10-CM | POA: Insufficient documentation

## 2014-12-11 DIAGNOSIS — E118 Type 2 diabetes mellitus with unspecified complications: Secondary | ICD-10-CM

## 2014-12-13 NOTE — Progress Notes (Signed)

## 2014-12-18 ENCOUNTER — Ambulatory Visit: Payer: Medicare HMO

## 2015-01-29 DIAGNOSIS — E119 Type 2 diabetes mellitus without complications: Secondary | ICD-10-CM | POA: Diagnosis not present

## 2015-01-29 DIAGNOSIS — E785 Hyperlipidemia, unspecified: Secondary | ICD-10-CM | POA: Diagnosis not present

## 2015-02-22 DIAGNOSIS — Z0100 Encounter for examination of eyes and vision without abnormal findings: Secondary | ICD-10-CM | POA: Diagnosis not present

## 2015-05-02 DIAGNOSIS — K219 Gastro-esophageal reflux disease without esophagitis: Secondary | ICD-10-CM | POA: Diagnosis not present

## 2015-05-02 DIAGNOSIS — E119 Type 2 diabetes mellitus without complications: Secondary | ICD-10-CM | POA: Diagnosis not present

## 2015-05-02 DIAGNOSIS — R109 Unspecified abdominal pain: Secondary | ICD-10-CM | POA: Diagnosis not present

## 2015-05-02 DIAGNOSIS — E782 Mixed hyperlipidemia: Secondary | ICD-10-CM | POA: Diagnosis not present

## 2015-05-02 DIAGNOSIS — N390 Urinary tract infection, site not specified: Secondary | ICD-10-CM | POA: Diagnosis not present

## 2015-05-02 DIAGNOSIS — J069 Acute upper respiratory infection, unspecified: Secondary | ICD-10-CM | POA: Diagnosis not present

## 2015-05-02 DIAGNOSIS — J309 Allergic rhinitis, unspecified: Secondary | ICD-10-CM | POA: Diagnosis not present

## 2015-05-02 DIAGNOSIS — Z23 Encounter for immunization: Secondary | ICD-10-CM | POA: Diagnosis not present

## 2015-05-29 DIAGNOSIS — S61219A Laceration without foreign body of unspecified finger without damage to nail, initial encounter: Secondary | ICD-10-CM | POA: Diagnosis not present

## 2015-07-16 DIAGNOSIS — F419 Anxiety disorder, unspecified: Secondary | ICD-10-CM | POA: Diagnosis not present

## 2015-07-16 DIAGNOSIS — N489 Disorder of penis, unspecified: Secondary | ICD-10-CM | POA: Diagnosis not present

## 2015-07-16 DIAGNOSIS — J329 Chronic sinusitis, unspecified: Secondary | ICD-10-CM | POA: Diagnosis not present

## 2015-07-16 DIAGNOSIS — R300 Dysuria: Secondary | ICD-10-CM | POA: Diagnosis not present

## 2015-07-16 DIAGNOSIS — K589 Irritable bowel syndrome without diarrhea: Secondary | ICD-10-CM | POA: Diagnosis not present

## 2015-11-04 DIAGNOSIS — E785 Hyperlipidemia, unspecified: Secondary | ICD-10-CM | POA: Diagnosis not present

## 2015-11-04 DIAGNOSIS — E119 Type 2 diabetes mellitus without complications: Secondary | ICD-10-CM | POA: Diagnosis not present

## 2015-11-04 DIAGNOSIS — Z1211 Encounter for screening for malignant neoplasm of colon: Secondary | ICD-10-CM | POA: Diagnosis not present

## 2015-11-04 DIAGNOSIS — Z0000 Encounter for general adult medical examination without abnormal findings: Secondary | ICD-10-CM | POA: Diagnosis not present

## 2015-11-04 DIAGNOSIS — J309 Allergic rhinitis, unspecified: Secondary | ICD-10-CM | POA: Diagnosis not present

## 2015-11-04 DIAGNOSIS — K219 Gastro-esophageal reflux disease without esophagitis: Secondary | ICD-10-CM | POA: Diagnosis not present

## 2015-11-20 DIAGNOSIS — M5431 Sciatica, right side: Secondary | ICD-10-CM | POA: Diagnosis not present

## 2016-04-20 DIAGNOSIS — R202 Paresthesia of skin: Secondary | ICD-10-CM | POA: Diagnosis not present

## 2016-05-11 DIAGNOSIS — Z23 Encounter for immunization: Secondary | ICD-10-CM | POA: Diagnosis not present

## 2016-05-11 DIAGNOSIS — F419 Anxiety disorder, unspecified: Secondary | ICD-10-CM | POA: Diagnosis not present

## 2016-05-11 DIAGNOSIS — E119 Type 2 diabetes mellitus without complications: Secondary | ICD-10-CM | POA: Diagnosis not present

## 2016-05-11 DIAGNOSIS — R202 Paresthesia of skin: Secondary | ICD-10-CM | POA: Diagnosis not present

## 2016-05-11 DIAGNOSIS — E785 Hyperlipidemia, unspecified: Secondary | ICD-10-CM | POA: Diagnosis not present

## 2016-05-27 ENCOUNTER — Encounter: Payer: Self-pay | Admitting: Neurology

## 2016-05-27 ENCOUNTER — Ambulatory Visit (INDEPENDENT_AMBULATORY_CARE_PROVIDER_SITE_OTHER): Payer: Commercial Managed Care - HMO | Admitting: Neurology

## 2016-05-27 VITALS — BP 132/70 | HR 52 | Resp 20 | Ht 74.0 in | Wt 196.0 lb

## 2016-05-27 DIAGNOSIS — G518 Other disorders of facial nerve: Secondary | ICD-10-CM | POA: Diagnosis not present

## 2016-05-27 NOTE — Patient Instructions (Addendum)
Your exam looks reassuring, no obvious skin lesions or lumps or bumps.  You may have pain from nerve irritation. I would agree with continuing the gabapentin 300 mg each night. The most common side effects reported are sedation or sleepiness. Rare side effects include balance problems, confusion.  We will do a brain scan, called MRI and call you with the test results. We will have to schedule you for this on a separate date. This test requires authorization from your insurance, and we will take care of the insurance process.

## 2016-05-27 NOTE — Progress Notes (Signed)
Subjective:    Patient ID: Ivan Flores is a 81 y.o. male.  HPI     Star Age, MD, PhD [facility] [address] [place],  [address]  Dear Dr. Moreen Fowler,   I saw your patient, Ivan Flores, upon your kind request in my neurologic clinic today for initial consultationseizures. The patient is unaccompanied today. As you know, Ivan Flores is a 81 year old right-handed gentleman with an underlying medical history of reflux disease, hyperlipidemia, type 2 diabetes, allergic rhinitis, irritable bowel syndrome, history of sinusitis, anxiety, depression, low back pain, and overweight state, who reports abnormal sensations in his scalp area on the left and painful jolts of pain on the L side of the scalp for the past 6-8 weeks, started suddenly and short-lived, no associated neurologic  accompaniments, initially, multiple times per day, daily. No prodromal illness, no rash, no shingles, no injuries, but has had prior head injuries in the distant past. No recent dermatological procedure.  I reviewed your office note from 04/20/2016. He was symptomatically started on a trial of gabapentin 300 mg qHS, which was helpful with time, still taking it, and denies side effects, with the exception of mild balance issues lately noted. Has residual soreness feeling, no daily jolts. Somewhat sensitive to touch, not waking him up from sleep, but sleep was disrupted before. He had blood work this year in your office which I reviewed. He had a CBC with differential on 11/04/2015 which was unremarkable, CMP was unremarkable with the exception of glucose level at 134, TSH was in the normal range, lipid panel showed total cholesterol of 172, triglycerides at 127, LDL borderline at 102. Urinalysis was negative last year in November. A1c in November 2016 was 5.5.  His Past Medical History Is Significant For: Past Medical History:  Diagnosis Date  . Acid reflux   .  Amputation finger    right index finger  . Anxiety   . Depression   . Diabetes mellitus without complication ([place])   . Dyslipidemia   . Fatigue   . Hyperlipidemia   . Impotence   . Low back pain   . Sinusitis   . Spastic colon     His Past Surgical History Is Significant For: Past Surgical History:  Procedure Laterality Date  . NECK SURGERY      His Family History Is Significant For: Family History  Problem Relation Age of Onset  . Heart disease Father     His Social History Is Significant For: Social History   Social History  . Marital status: Married    Spouse name: N/A  . Number of children: N/A  . Years of education: N/A   Social History Main Topics  . Smoking status: Never Smoker  . Smokeless tobacco: None  . Alcohol use No  . Drug use: No  . Sexual activity: Not Asked   Other Topics Concern  . None   Social History Narrative  . None    His Allergies Are:  Allergies  Allergen Reactions  . Erythromycin   . Hydrocodone Bitartrate Er   . Keflex [Cephalexin]   . Metformin And Related   . Pseudoephedrine   :   His Current Medications Are:  Outpatient Encounter Prescriptions as of 05/27/2016  Medication Sig  . azelastine (ASTELIN) 0.1 % nasal spray Place 2 sprays into both nostrils 2 (two) times daily. Use in each nostril as directed  . cephALEXin (KEFLEX) 500 MG capsule Take 1 capsule (500  mg total) by mouth 4 (four) times daily.  . cetirizine (ZYRTEC) 10 MG tablet Take 10 mg by mouth daily.  . fenofibrate 160 MG tablet Take 160 mg by mouth daily.  . fluticasone (FLONASE) 50 MCG/ACT nasal spray Place 2 sprays into the nose daily.  Marland Kitchen gabapentin (NEURONTIN) 300 MG capsule Take 300 mg by mouth at bedtime.  Marland Kitchen glimepiride (AMARYL) 4 MG tablet Take 4 mg by mouth daily with breakfast.  . HYDROcodone-acetaminophen (NORCO/VICODIN) 5-325 MG per tablet Take 1 tablet by mouth every 8 (eight) hours as needed for pain.  Marland Kitchen ibuprofen (ADVIL,MOTRIN) 200 MG tablet  Take 600 mg by mouth every 6 (six) hours as needed for pain.  . pantoprazole (PROTONIX) 40 MG tablet Take 40 mg by mouth daily.  . ranitidine (ZANTAC) 150 MG tablet Take 150 mg by mouth 2 (two) times daily.  . sildenafil (VIAGRA) 100 MG tablet Take 100 mg by mouth daily as needed for erectile dysfunction.  . sitaGLIPtin (JANUVIA) 100 MG tablet Take 100 mg by mouth daily.  Marland Kitchen tiZANidine (ZANAFLEX) 2 MG tablet Take by mouth at bedtime.  . traMADol (ULTRAM) 50 MG tablet Take by mouth every 6 (six) hours as needed.   No facility-administered encounter medications on file as of 05/27/2016.   : Review of Systems:  Out of a complete 14 point review of systems, all are reviewed and negative with the exception of these symptoms as listed below:  Review of Systems  Neurological: Positive for headaches.       Pt presents today to discuss pain in his scalp. At times, the pain can be severe. Dr. Dema Severin has prescribed pt gabapentin for the pain/tingling in his scalp and this seems to help some.    Objective:  Neurologic Exam  Physical Exam Physical Examination:   Vitals:   05/27/16 1039  BP: 132/70  Pulse: (!) 52  Resp: 20   General Examination: The patient is a very pleasant 81 y.o. male in no acute distress. He appears well-developed and well-nourished and well groomed.   HEENT: Normocephalic, atraumatic, pupils are equal, round and reactive to light and accommodation. No lesions on the scalp, has prior scars, on the R parietal area and frontal areas from prior/old injuries. Extraocular tracking is good without limitation to gaze excursion or nystagmus noted. Normal smooth pursuit is noted. Hearing is grossly intact, subjectively less on the R. Face is symmetric with normal facial animation and normal facial sensation. Speech is clear with no dysarthria noted. There is no hypophonia. There is no lip, neck/head, jaw or voice tremor. Neck is supple with full range of passive and active motion. There  are no carotid bruits on auscultation. Oropharynx exam reveals: mild mouth dryness, adequate dental hygiene and mild airway crowding, due to redundant soft palate. Mallampati is class II. Tongue protrudes centrally and palate elevates symmetrically. Tonsils are absent.   Chest: Clear to auscultation without wheezing, rhonchi or crackles noted.  Heart: S1+S2+0, regular and normal without murmurs, rubs or gallops noted.   Abdomen: Soft, non-tender and non-distended with normal bowel sounds appreciated on auscultation.  Extremities: There is no pitting edema in the distal lower extremities bilaterally. Pedal pulses are intact.  Skin: Warm and dry without trophic changes noted.  Musculoskeletal: exam reveals no obvious joint deformities, tenderness or joint swelling or erythema.   Neurologically:  Mental status: The patient is awake, alert and oriented in all 4 spheres. His immediate and remote memory, attention, language skills and fund of knowledge  are appropriate. There is no evidence of aphasia, agnosia, apraxia or anomia. Speech is clear with normal prosody and enunciation. Thought process is linear. Mood is normal and affect is normal.  Cranial nerves II - XII are as described above under HEENT exam. In addition: shoulder shrug is normal with equal shoulder height noted. Motor exam: Normal bulk, strength and tone is noted. There is no drift, tremor or rebound. Romberg is negative. Reflexes are 2+ in the UEs an 1+ in the knees, absent in the ankles. Fine motor skills and coordination: intact with normal finger taps, normal hand movements, normal rapid alternating patting, normal foot taps and normal foot agility.  Cerebellar testing: No dysmetria or intention tremor on finger to nose testing. Heel to shin is unremarkable bilaterally. There is no truncal or gait ataxia.  Sensory exam: intact to light touch, pinprick, vibration, temperature sense in the upper and lower extremities.  Gait, station  and balance: He stands easily. No veering to one side is noted. No leaning to one side is noted. Posture is age-appropriate and stance is narrow based. Gait shows normal stride length and normal pace. No problems turning are noted. Tandem walk is challenging for him.   Assessment and Plan:   In summary, Ivan Flores is a very pleasant 81 y.o.-year old male with an underlying medical history of reflux disease, hyperlipidemia, type 2 diabetes, allergic rhinitis, irritable bowel syndrome, history of sinusitis, anxiety, depression, low back pain, and overweight state, who reports abnormal sensations in his scalp area on the left and painful jolts of pain on the L side of the scalp for the past 6-8 weeks. Exam looks reassuring and history is concerning for neuralgic pain, with symptomatic treatment with gabapentin lower dose. Nevertheless, to rule out her structural cause of his new onset pain I would like to proceed with a brain MRI without contrast. He had preserved kidney function recently according to blood test results. He has been seeing his eye doctor on a regular basis. I would be cautious with increase of the gabapentin because he does report some interim balance issues. He is advised to be vigilant about grogginess and balance issues. He is advised to stay well-hydrated and change positions slowly and avoid climbing on ladders or witwork at heights. He is a very active gentleman which I'm quite pleased to see. I suggested a 3 month follow-up, sooner as needed. I answered all his questions today and he was in agreement.   Thank you very much for allowing me to participate in the care of this nice patient. If I can be of any further assistance to you please do not hesitate to call me at [phone].  Sincerely,   Star Age, MD, PhD

## 2016-08-14 DIAGNOSIS — R0789 Other chest pain: Secondary | ICD-10-CM | POA: Diagnosis not present

## 2016-08-14 DIAGNOSIS — E119 Type 2 diabetes mellitus without complications: Secondary | ICD-10-CM | POA: Diagnosis not present

## 2016-08-14 DIAGNOSIS — F419 Anxiety disorder, unspecified: Secondary | ICD-10-CM | POA: Diagnosis not present

## 2016-08-26 ENCOUNTER — Encounter: Payer: Self-pay | Admitting: Neurology

## 2016-08-26 ENCOUNTER — Telehealth: Payer: Self-pay

## 2016-08-26 ENCOUNTER — Ambulatory Visit (INDEPENDENT_AMBULATORY_CARE_PROVIDER_SITE_OTHER): Payer: Medicare HMO | Admitting: Neurology

## 2016-08-26 VITALS — BP 146/62 | HR 58 | Resp 16 | Ht 74.0 in | Wt 201.0 lb

## 2016-08-26 DIAGNOSIS — G518 Other disorders of facial nerve: Secondary | ICD-10-CM | POA: Diagnosis not present

## 2016-08-26 MED ORDER — GABAPENTIN 300 MG PO CAPS: 300.0000 mg | ORAL_CAPSULE | Freq: Every day | ORAL | 3 refills | Status: DC

## 2016-08-26 NOTE — Telephone Encounter (Signed)
If he did, where? So we can get those records.

## 2016-08-26 NOTE — Patient Instructions (Addendum)
We will continue with your gabapentin at 300 mg each evening, take around 9 PM.  We will schedule your brain MRI at your convenience and call you with the results.  You can follow up with one of our nurse practitioners.

## 2016-08-26 NOTE — Telephone Encounter (Signed)
LM for patient to call back and let us know if he had MRI that Dr. Rexene Alberts ordered at last office visit?

## 2016-08-26 NOTE — Progress Notes (Signed)
Subjective:    Patient ID: Ivan Flores is a 82 y.o. male.  HPI     Interim history:   Mr. Vannice is a 82 year old right-handed gentleman with an underlying medical history of reflux disease, hyperlipidemia, type 2 diabetes, allergic rhinitis, irritable bowel syndrome, history of sinusitis, anxiety, depression, low back pain, and overweight state, who presents for follow-up consultation of his left-sided facial and head pain as well as paresthesias. The patient is unaccompanied today. I first met him on 05/27/2016 at the request of his primary care physician, at which time he reported a several week history of intermittent abnormal sensations on the left side of his scalp and head including painful jolts, concern for neuralgic pain. He was on symptomatic treatment with gabapentin. I suggested we proceed with a brain MRI, but he did not have it done.   Today, 08/26/2016 (all dictated new, as well as above notes, some dictation done in note pad or Word, outside of chart, may appear as copied):  He reports doing better with the gabapentin. He forgot about scheduling the MRI.  Tolerates the gabapentin at 300 mg, but may have a little daytime tiredness from it. He is not a good sleeper at night, usually in bed by 11PM but watches TV for about an hour or so, sometimes not asleep till 1 AM. This has not changed lately. Overall doing better, no new symptoms. Had recent cold symptoms, but no flu-like illness.    The patient's allergies, current medications, family history, past medical history, past social history, past surgical history and problem list were reviewed and updated as appropriate.   Previously (copied from previous notes for reference):   05/27/2016: He reports abnormal sensations in his scalp area on the left and painful jolts of pain on the L side of the scalp for the past 6-8 weeks, started suddenly and short-lived, no associated neurologic  accompaniments, initially, multiple times  per day, daily. No prodromal illness, no rash, no shingles, no injuries, but has had prior head injuries in the distant past. No recent dermatological procedure.  I reviewed your office note from 04/20/2016. He was symptomatically started on a trial of gabapentin 300 mg qHS, which was helpful with time, still taking it, and denies side effects, with the exception of mild balance issues lately noted. Has residual soreness feeling, no daily jolts. Somewhat sensitive to touch, not waking him up from sleep, but sleep was disrupted before. He had blood work this year in your office which I reviewed. He had a CBC with differential on 11/04/2015 which was unremarkable, CMP was unremarkable with the exception of glucose level at 134, TSH was in the normal range, lipid panel showed total cholesterol of 172, triglycerides at 127, LDL borderline at 102. Urinalysis was negative last year in November. A1c in November 2016 was 5.5.  His Past Medical History Is Significant For: Past Medical History:  Diagnosis Date  . Acid reflux   . Amputation finger    right index finger  . Anxiety   . Depression   . Diabetes mellitus without complication ([place])   . Dyslipidemia   . Fatigue   . Hyperlipidemia   . Impotence   . Low back pain   . Sinusitis   . Spastic colon     His Past Surgical History Is Significant For: Past Surgical History:  Procedure Laterality Date  . NECK SURGERY      His Family History Is Significant For: Family History  Problem Relation Age  of Onset  . Heart disease Father     His Social History Is Significant For: Social History   Social History  . Marital status: Married    Spouse name: N/A  . Number of children: N/A  . Years of education: N/A   Social History Main Topics  . Smoking status: Never Smoker  . Smokeless tobacco: Never Used  . Alcohol use No  . Drug use: No  . Sexual activity: Not Asked   Other Topics Concern  . None   Social History Narrative  . None     His Allergies Are:  Allergies  Allergen Reactions  . Erythromycin   . Hydrocodone Bitartrate Er   . Keflex [Cephalexin]   . Metformin And Related   . Pseudoephedrine   :   His Current Medications Are:  Outpatient Encounter Prescriptions as of 08/26/2016  Medication Sig  . azelastine (ASTELIN) 0.1 % nasal spray Place 2 sprays into both nostrils 2 (two) times daily. Use in each nostril as directed  . cephALEXin (KEFLEX) 500 MG capsule Take 1 capsule (500 mg total) by mouth 4 (four) times daily.  . cetirizine (ZYRTEC) 10 MG tablet Take 10 mg by mouth daily.  . fenofibrate 160 MG tablet Take 160 mg by mouth daily.  . fluticasone (FLONASE) 50 MCG/ACT nasal spray Place 2 sprays into the nose daily.  Marland Kitchen gabapentin (NEURONTIN) 300 MG capsule Take 300 mg by mouth at bedtime.  Marland Kitchen glimepiride (AMARYL) 4 MG tablet Take 4 mg by mouth daily with breakfast.  . HYDROcodone-acetaminophen (NORCO/VICODIN) 5-325 MG per tablet Take 1 tablet by mouth every 8 (eight) hours as needed for pain.  Marland Kitchen ibuprofen (ADVIL,MOTRIN) 200 MG tablet Take 600 mg by mouth every 6 (six) hours as needed for pain.  . pantoprazole (PROTONIX) 40 MG tablet Take 40 mg by mouth daily.  . ranitidine (ZANTAC) 150 MG tablet Take 150 mg by mouth 2 (two) times daily.  . sildenafil (VIAGRA) 100 MG tablet Take 100 mg by mouth daily as needed for erectile dysfunction.  . sitaGLIPtin (JANUVIA) 100 MG tablet Take 100 mg by mouth daily.  Marland Kitchen tiZANidine (ZANAFLEX) 2 MG tablet Take by mouth at bedtime.  . traMADol (ULTRAM) 50 MG tablet Take by mouth every 6 (six) hours as needed.   No facility-administered encounter medications on file as of 08/26/2016.   :  Review of Systems:  Out of a complete 14 point review of systems, all are reviewed and negative with the exception of these symptoms as listed below: Review of Systems  Neurological:       Patient states that he is taking the Gabapentin and it does help his facial/head pain.  Patient did  not have MRI.     Objective:  Neurologic Exam  Physical Exam Physical Examination:   Vitals:   08/26/16 1104  BP: (!) 146/62  Pulse: (!) 58  Resp: 16    General Examination: The patient is a very pleasant 82 y.o. male in no acute distress. He appears well-developed and well-nourished and well groomed. Good spirits today.   HEENT: Normocephalic, atraumatic, pupils are equal, round and reactive to light and accommodation. Extraocular tracking is good without limitation to gaze excursion or nystagmus noted. Normal smooth pursuit is noted. Hearing is grossly intact. Face is symmetric with normal facial animation and normal facial sensation. Speech is clear with no dysarthria noted. There is no hypophonia. There is no lip, neck/head, jaw or voice tremor. Neck is supple with full range  of passive and active motion. There are no carotid bruits on auscultation. Oropharynx exam reveals: mild mouth dryness, adequate dental hygiene and mild airway crowding. Mallampati is class II. Tongue protrudes centrally and palate elevates symmetrically. Tonsils are absent.    Chest: Clear to auscultation without wheezing, rhonchi or crackles noted.  Heart: S1+S2+0, regular and normal without murmurs, rubs or gallops noted.   Abdomen: Soft, non-tender and non-distended with normal bowel sounds appreciated on auscultation.  Extremities: no new issues or pain or swelling.  Skin: Warm and dry without trophic changes noted.  Musculoskeletal: exam reveals no obvious joint deformities, tenderness or joint swelling or erythema.   Neurologically:  Mental status: The patient is awake, alert and oriented in all 4 spheres. His immediate and remote memory, attention, language skills and fund of knowledge are appropriate. There is no evidence of aphasia, agnosia, apraxia or anomia. Speech is clear with normal prosody and enunciation. Thought process is linear. Mood is normal and affect is normal.  Cranial nerves II -  XII are as described above under HEENT exam. In addition: shoulder shrug is normal with equal shoulder height noted. Motor exam: Normal bulk, strength and tone is noted. There is no drift, tremor or rebound. Romberg is negative. Reflexes are 1-2+. Fine motor skills and coordination: intact for age.   Cerebellar testing: No dysmetria or intention tremor.  Sensory exam: intact to light touch in the upper and lower extremities.  Gait, station and balance: He stands easily. No veering to one side is noted. No leaning to one side is noted. Posture is age-appropriate and stance is narrow based. Gait shows normal stride length and pace.              Assessment and Plan:   In summary, ZEPHANIAH LUBRANO is a very pleasant 82 y.o.-year old male with an Underlying medical history of reflux disease, type 2 diabetes, allergic rhinitis, IBS, sinusitis, anxiety, depression, hyperlipidemia, and low back pain, who presents for follow-up consultation of his left-sided facial and scalp pain of approximately 5 months duration, he has had subjective improvement with symptomatic treatment with gabapentin. His physical exam is nonfocal which is reassuring, he can continue with gabapentin 300 mg at night, he does report perhaps mild daytime sedation from it. I asked him to not take it that late, perhaps around 9 PM rather than 11 PM. He is in the habit of going to bed late and watching TV in bed. In addition, he typically takes an afternoon nap. He did not pursue the brain MRI and reports that he forgot about it. I would like to make sure that we look at his brain with a brain MRI for completion and to rule out any structural cause for his symptoms. To that end, we will schedule his brain MRI at his convenience and call him with his test results. I renewed his prescription for gabapentin 300 mg each night with a 90 day prescription and refills. I suggested a 6 month follow-up, he can see one of our nurse practitioners at the time  as he is doing well. I will see him back after that. I answered all his questions today and he was in agreement.  I spent 20 minutes in total face-to-face time with the patient, more than 50% of which was spent in counseling and coordination of care, reviewing test results, reviewing medication and discussing or reviewing the diagnosis of facial pain, the prognosis and treatment options. Pertinent laboratory and imaging test results that  were available during this visit with the patient were reviewed by me and considered in my medical decision making (see chart for details).

## 2016-09-07 ENCOUNTER — Ambulatory Visit (INDEPENDENT_AMBULATORY_CARE_PROVIDER_SITE_OTHER): Payer: Self-pay

## 2016-09-07 DIAGNOSIS — R51 Headache: Secondary | ICD-10-CM | POA: Diagnosis not present

## 2016-09-07 DIAGNOSIS — G518 Other disorders of facial nerve: Secondary | ICD-10-CM

## 2016-09-07 DIAGNOSIS — R930 Abnormal findings on diagnostic imaging of skull and head, not elsewhere classified: Secondary | ICD-10-CM | POA: Diagnosis not present

## 2016-09-07 DIAGNOSIS — Z0289 Encounter for other administrative examinations: Secondary | ICD-10-CM

## 2016-09-10 NOTE — Progress Notes (Signed)
Please call patient regarding the recent brain MRI: The brain scan showed a normal structure of the brain and no significant volume loss which we call atrophy. There were changes in the deeper structures of the brain, which we call white matter changes or microvascular changes. These were reported as mild in His case. These are tiny white spots, that occur with time and are seen in a variety of conditions, including with normal aging, chronic hypertension, chronic headaches, especially migraine HAs, chronic diabetes, chronic hyperlipidemia. These are not strokes and no mass or lesion or contrast enhancement was seen which is reassuring.  No cause for his L facial pain found.  Again, there were no acute findings, such as a stroke, or mass or blood products. No further action is required on this test at this time, other than re-enforcing the importance of good blood pressure control, good cholesterol control, good blood sugar control, and weight management. Please remind patient to keep any upcoming appointments or tests and to call us with any interim questions, concerns, problems or updates. Thanks,  Star Age, MD, PhD

## 2016-09-17 ENCOUNTER — Telehealth: Payer: Self-pay

## 2016-09-17 NOTE — Telephone Encounter (Signed)
-----   Message from Star Age, MD sent at 09/10/2016  4:46 PM EDT ----- Please call patient regarding the recent brain MRI: The brain scan showed a normal structure of the brain and no significant volume loss which we call atrophy. There were changes in the deeper structures of the brain, which we call white matter changes or microvascular changes. These were reported as mild in His case. These are tiny white spots, that occur with time and are seen in a variety of conditions, including with normal aging, chronic hypertension, chronic headaches, especially migraine HAs, chronic diabetes, chronic hyperlipidemia. These are not strokes and no mass or lesion or contrast enhancement was seen which is reassuring.  No cause for his L facial pain found.  Again, there were no acute findings, such as a stroke, or mass or blood products. No further action is required on this test at this time, other than re-enforcing the importance of good blood pressure control, good cholesterol control, good blood sugar control, and weight management. Please remind patient to keep any upcoming appointments or tests and to call us with any interim questions, concerns, problems or updates. Thanks,  Star Age, MD, PhD

## 2016-09-17 NOTE — Telephone Encounter (Signed)
LM on cell to call back. Called home number, no answer and no vm.

## 2016-10-01 NOTE — Telephone Encounter (Signed)
I was able to reach patient. He is aware of results below. States that he is doing better with Gabapentin, it is also helping with some of his Diabetic pain. He wants to thank you for everything and will see [place] back in September.

## 2016-11-17 DIAGNOSIS — S90862A Insect bite (nonvenomous), left foot, initial encounter: Secondary | ICD-10-CM | POA: Diagnosis not present

## 2016-11-17 DIAGNOSIS — W57XXXA Bitten or stung by nonvenomous insect and other nonvenomous arthropods, initial encounter: Secondary | ICD-10-CM | POA: Diagnosis not present

## 2016-11-17 DIAGNOSIS — L03032 Cellulitis of left toe: Secondary | ICD-10-CM | POA: Diagnosis not present

## 2016-12-10 DIAGNOSIS — E119 Type 2 diabetes mellitus without complications: Secondary | ICD-10-CM | POA: Diagnosis not present

## 2016-12-10 DIAGNOSIS — J309 Allergic rhinitis, unspecified: Secondary | ICD-10-CM | POA: Diagnosis not present

## 2016-12-10 DIAGNOSIS — Z7984 Long term (current) use of oral hypoglycemic drugs: Secondary | ICD-10-CM | POA: Diagnosis not present

## 2016-12-10 DIAGNOSIS — R0789 Other chest pain: Secondary | ICD-10-CM | POA: Diagnosis not present

## 2016-12-10 DIAGNOSIS — E785 Hyperlipidemia, unspecified: Secondary | ICD-10-CM | POA: Diagnosis not present

## 2016-12-10 DIAGNOSIS — L989 Disorder of the skin and subcutaneous tissue, unspecified: Secondary | ICD-10-CM | POA: Diagnosis not present

## 2016-12-10 DIAGNOSIS — R946 Abnormal results of thyroid function studies: Secondary | ICD-10-CM | POA: Diagnosis not present

## 2016-12-22 DIAGNOSIS — E785 Hyperlipidemia, unspecified: Secondary | ICD-10-CM | POA: Diagnosis not present

## 2016-12-22 DIAGNOSIS — N529 Male erectile dysfunction, unspecified: Secondary | ICD-10-CM | POA: Diagnosis not present

## 2016-12-22 DIAGNOSIS — E119 Type 2 diabetes mellitus without complications: Secondary | ICD-10-CM | POA: Diagnosis not present

## 2016-12-22 DIAGNOSIS — F17201 Nicotine dependence, unspecified, in remission: Secondary | ICD-10-CM | POA: Diagnosis not present

## 2016-12-22 DIAGNOSIS — Z0000 Encounter for general adult medical examination without abnormal findings: Secondary | ICD-10-CM | POA: Diagnosis not present

## 2016-12-22 DIAGNOSIS — Z23 Encounter for immunization: Secondary | ICD-10-CM | POA: Diagnosis not present

## 2017-02-25 DIAGNOSIS — E1165 Type 2 diabetes mellitus with hyperglycemia: Secondary | ICD-10-CM | POA: Diagnosis not present

## 2017-02-25 DIAGNOSIS — Z23 Encounter for immunization: Secondary | ICD-10-CM | POA: Diagnosis not present

## 2017-02-25 DIAGNOSIS — R946 Abnormal results of thyroid function studies: Secondary | ICD-10-CM | POA: Diagnosis not present

## 2017-02-25 DIAGNOSIS — E785 Hyperlipidemia, unspecified: Secondary | ICD-10-CM | POA: Diagnosis not present

## 2017-02-25 DIAGNOSIS — R001 Bradycardia, unspecified: Secondary | ICD-10-CM | POA: Diagnosis not present

## 2017-02-25 DIAGNOSIS — R5383 Other fatigue: Secondary | ICD-10-CM | POA: Diagnosis not present

## 2017-02-25 DIAGNOSIS — R2689 Other abnormalities of gait and mobility: Secondary | ICD-10-CM | POA: Diagnosis not present

## 2017-02-25 DIAGNOSIS — R0609 Other forms of dyspnea: Secondary | ICD-10-CM | POA: Diagnosis not present

## 2017-03-02 ENCOUNTER — Encounter: Payer: Self-pay | Admitting: Adult Health

## 2017-03-02 ENCOUNTER — Ambulatory Visit (INDEPENDENT_AMBULATORY_CARE_PROVIDER_SITE_OTHER): Payer: Medicare HMO | Admitting: Adult Health

## 2017-03-02 ENCOUNTER — Telehealth: Payer: Self-pay

## 2017-03-02 VITALS — BP 122/66 | HR 56 | Wt 194.2 lb

## 2017-03-02 DIAGNOSIS — G518 Other disorders of facial nerve: Secondary | ICD-10-CM

## 2017-03-02 DIAGNOSIS — M5481 Occipital neuralgia: Secondary | ICD-10-CM

## 2017-03-02 NOTE — Progress Notes (Addendum)
PATIENT: Ivan Flores DOB: 09/15/1934  REASON FOR VISIT: follow up- facial pain  HISTORY FROM: patient  HISTORY OF PRESENT ILLNESS: Today 03/02/17 Mr. Ivan Flores is an 82 year old male with a history of left-sided facial pain. He returns today for follow-up. He states that gabapentin has improved his discomfort although he reports in the last 4-5 weeks he has been having more discomfort. He states that it does not occur daily but he tends to get pain in the back of the head on the left side. He does describe it as a sharp pain that will last for approximately 30-40 minutes. He states on a pain scale it is typically a 5 out of 10. He denies any additional symptoms. The patient states that he is tolerating gabapentin well although he is not interested in increasing the medication at this time. He denies any additional neurologic symptoms. He returns today for follow-up.  HISTORY copied from Dr. Guadelupe Sabin notes: Mr. Ivan Flores is a 82 year old right-handed gentleman with an underlying medical history of reflux disease, hyperlipidemia, type 2 diabetes, allergic rhinitis, irritable bowel syndrome, history of sinusitis, anxiety, depression, low back pain, and overweight state, who presents for follow-up consultation of his left-sided facial and head pain as well as paresthesias. The patient is unaccompanied today. I first met him on 05/27/2016 at the request of his primary care physician, at which time he reported a several week history of intermittent abnormal sensations on the left side of his scalp and head including painful jolts, concern for neuralgic pain. He was on symptomatic treatment with gabapentin. I suggested we proceed with a brain MRI, but he did not have it done.   08/26/2016  He reports doing better with the gabapentin. He forgot about scheduling the MRI.  Tolerates the gabapentin at 300 mg, but may have a little daytime tiredness from it. He is not a good sleeper at night, usually in  bed by 11PM but watches TV for about an hour or so, sometimes not asleep till 1 AM. This has not changed lately. Overall doing better, no new symptoms. Had recent cold symptoms, but no flu-like illness.    The patient's allergies, current medications, family history, past medical history, past social history, past surgical history and problem list were reviewed and updated as appropriate.   REVIEW OF SYSTEMS: Out of a complete 14 system review of symptoms, the patient complains only of the following symptoms, and all other reviewed systems are negative.  Ear pain, wounds, headache  ALLERGIES: Allergies  Allergen Reactions  . Erythromycin   . Hydrocodone Bitartrate Er   . Keflex [Cephalexin]   . Metformin And Related   . Pseudoephedrine     HOME MEDICATIONS: Outpatient Medications Prior to Visit  Medication Sig Dispense Refill  . azelastine (ASTELIN) 0.1 % nasal spray Place 2 sprays into both nostrils 2 (two) times daily. Use in each nostril as directed    . cephALEXin (KEFLEX) 500 MG capsule Take 1 capsule (500 mg total) by mouth 4 (four) times daily. 20 capsule 0  . cetirizine (ZYRTEC) 10 MG tablet Take 10 mg by mouth daily.    . fenofibrate 160 MG tablet Take 160 mg by mouth daily.    . fluticasone (FLONASE) 50 MCG/ACT nasal spray Place 2 sprays into the nose daily.    Marland Kitchen gabapentin (NEURONTIN) 300 MG capsule Take 1 capsule (300 mg total) by mouth at bedtime. 90 capsule 3  . glimepiride (AMARYL) 4 MG tablet Take 4 mg by mouth  daily with breakfast.    . HYDROcodone-acetaminophen (NORCO/VICODIN) 5-325 MG per tablet Take 1 tablet by mouth every 8 (eight) hours as needed for pain. 20 tablet 0  . ibuprofen (ADVIL,MOTRIN) 200 MG tablet Take 600 mg by mouth every 6 (six) hours as needed for pain.    . pantoprazole (PROTONIX) 40 MG tablet Take 40 mg by mouth daily.    . ranitidine (ZANTAC) 150 MG tablet Take 150 mg by mouth 2 (two) times daily.    . sildenafil (VIAGRA) 100 MG tablet Take  100 mg by mouth daily as needed for erectile dysfunction.    Marland Kitchen tiZANidine (ZANAFLEX) 2 MG tablet Take by mouth at bedtime.    . traMADol (ULTRAM) 50 MG tablet Take by mouth every 6 (six) hours as needed.    . sitaGLIPtin (JANUVIA) 100 MG tablet Take 100 mg by mouth daily.     No facility-administered medications prior to visit.     PAST MEDICAL HISTORY: Past Medical History:  Diagnosis Date  . Acid reflux   . Amputation finger    right index finger  . Anxiety   . Depression   . Diabetes mellitus without complication ([place])   . Dyslipidemia   . Fatigue   . Hyperlipidemia   . Impotence   . Low back pain   . Sinusitis   . Spastic colon     PAST SURGICAL HISTORY: Past Surgical History:  Procedure Laterality Date  . NECK SURGERY      FAMILY HISTORY: Family History  Problem Relation Age of Onset  . Heart disease Father     SOCIAL HISTORY: Social History   Social History  . Marital status: Married    Spouse name: N/A  . Number of children: N/A  . Years of education: N/A   Occupational History  . Not on file.   Social History Main Topics  . Smoking status: Never Smoker  . Smokeless tobacco: Never Used  . Alcohol use No  . Drug use: No  . Sexual activity: Not on file   Other Topics Concern  . Not on file   Social History Narrative  . No narrative on file      PHYSICAL EXAM  Vitals:   03/02/17 1352  BP: 122/66  Pulse: (!) 56  Weight: 194 lb 3.2 oz (88.1 kg)   Body mass index is 24.93 kg/m.  Generalized: Well developed, in no acute distress   Neurological examination  Mentation: Alert oriented to time, place, history taking. Follows all commands speech and language fluent Cranial nerve II-XII: Pupils were equal round reactive to light. Extraocular movements were full, visual field were full on confrontational test. Facial sensation and strength were normal. Uvula tongue midline. Head turning and shoulder shrug  were normal and symmetric. Motor:  The motor testing reveals 5 over 5 strength of all 4 extremities. Good symmetric motor tone is noted throughout.  Sensory: Sensory testing is intact to soft touch on all 4 extremities. No evidence of extinction is noted.  Coordination: Cerebellar testing reveals good finger-nose-finger and heel-to-shin bilaterally.  Gait and station: Gait is normal.  Reflexes: Deep tendon reflexes are symmetric and normal bilaterally.   DIAGNOSTIC DATA (LABS, IMAGING, TESTING) - I reviewed patient records, labs, notes, testing and imaging myself where available.  Lab Results  Component Value Date   WBC 9.4 12/04/2010   HGB 16.8 12/04/2010   HCT 46.7 12/04/2010   MCV 88.6 12/04/2010   PLT 218 12/04/2010  Component Value Date/Time   NA 138 12/04/2010 1459   K 5.0 12/04/2010 1459   CL 101 12/04/2010 1459   CO2 30 12/04/2010 1459   GLUCOSE 161 (H) 12/04/2010 1459   BUN 13 12/04/2010 1459   CREATININE 0.90 12/04/2010 1459   CALCIUM 9.6 12/04/2010 1459   PROT 6.5 10/03/2008 1730   ALBUMIN 3.6 10/03/2008 1730   AST 31 10/03/2008 1730   ALT 30 10/03/2008 1730   ALKPHOS 61 10/03/2008 1730   BILITOT 0.5 10/03/2008 1730   GFRNONAA >60 12/04/2010 1459   GFRAA >60 12/04/2010 1459       ASSESSMENT AND PLAN 82 y.o. year old male  has a past medical history of Acid reflux; Amputation finger; Anxiety; Depression; Diabetes mellitus without complication ([place]); Dyslipidemia; Fatigue; Hyperlipidemia; Impotence; Low back pain; Sinusitis; and Spastic colon. here with:  1. Neuralgic facial pain 2. Occipital neuralgia?  Patient is now having some discomfort in the back of the head on the left side. Perhaps this is an occipital neuralgia?. I suggested that we could potentially increase gabapentin to 400 mg daily. At this time the patient feels that this discomfort is manageable and not frequent enough to increase his medication. For now he will continue on gabapentin 300 mg at bedtime. He is advised that if  his symptoms worsen or he develops new symptoms he should let us know. He will follow-up in 6 months or sooner if needed.    Ward Givens, MSN, NP-C 03/02/2017, 2:12 PM [facility] [address], [place], [place] [address] [phone]  I reviewed the above note and documentation by the Nurse Practitioner and agree with the history, physical exam, assessment and plan as outlined above. I was immediately available for face-to-face consultation. Star Age, MD, PhD [facility])

## 2017-03-02 NOTE — Patient Instructions (Signed)
Your Plan:  Continue gabapentin 300 mg at bedtime. If your symptoms worsen then we can increase gabapentin to 400 mg  If your symptoms worsen or you develop new symptoms please let us know.   Thank you for coming to see [place] at [facility]. I hope we have been able to provide you high quality care today.  You may receive a patient satisfaction survey over the next few weeks. We would appreciate your feedback and comments so that we may continue to improve ourselves and the health of our patients.

## 2017-03-02 NOTE — Telephone Encounter (Signed)
SENT NOTES TO SCHEDULING 

## 2017-03-03 ENCOUNTER — Telehealth: Payer: Self-pay

## 2017-03-03 NOTE — Telephone Encounter (Signed)
SENT NOTES TO SCHEDULING 

## 2017-03-12 ENCOUNTER — Encounter: Payer: Self-pay | Admitting: Internal Medicine

## 2017-03-12 ENCOUNTER — Encounter (INDEPENDENT_AMBULATORY_CARE_PROVIDER_SITE_OTHER): Payer: Self-pay

## 2017-03-12 ENCOUNTER — Ambulatory Visit (INDEPENDENT_AMBULATORY_CARE_PROVIDER_SITE_OTHER): Payer: Medicare HMO

## 2017-03-12 ENCOUNTER — Ambulatory Visit (INDEPENDENT_AMBULATORY_CARE_PROVIDER_SITE_OTHER): Payer: Medicare HMO | Admitting: Internal Medicine

## 2017-03-12 VITALS — BP 132/70 | HR 49 | Ht 74.0 in | Wt 191.0 lb

## 2017-03-12 DIAGNOSIS — R001 Bradycardia, unspecified: Secondary | ICD-10-CM

## 2017-03-12 DIAGNOSIS — R0602 Shortness of breath: Secondary | ICD-10-CM | POA: Diagnosis not present

## 2017-03-12 DIAGNOSIS — E782 Mixed hyperlipidemia: Secondary | ICD-10-CM | POA: Diagnosis not present

## 2017-03-12 NOTE — Patient Instructions (Signed)
Your physician recommends that you continue on your current medications as directed. Please refer to the Current Medication list given to you today.  Your physician has recommended that you wear a holter monitor. Holter monitors are medical devices that record the heart's electrical activity. Doctors most often use these monitors to diagnose arrhythmias. Arrhythmias are problems with the speed or rhythm of the heartbeat. The monitor is a small, portable device. You can wear one while you do your normal daily activities. This is usually used to diagnose what is causing palpitations/syncope (passing out).  Your physician has requested that you have a lexiscan myoview. For further information please visit [email or web address]. Please follow instruction sheet, as given.

## 2017-03-12 NOTE — Progress Notes (Signed)
Cardiology Office Note   Date:  03/12/2017   ID:  Ivan Flores, DOB 1935/05/06, MRN [id number]  PCP:  Harlan Stains, MD  Cardiologist:   Dorris Carnes, MD   Pt is referred by Deland Pretty for bradycardia     History of Present Illness: Ivan Flores is a 82 y.o. male with a history of HL, GERD   Followed by C White  He was seen in early September  TOld of fatigue  Having problems with breathing for 1 wk  Had to sit down while foring on fenc  Noted decreased energy for several weeks.  Eyes not focusing  Nauseated     Havent felt great in 1 1/2 months  Lack of energy  Tired   In morning hot, almost nauseated  Then lays back down  OK Not sure if due to change in meds  No CP  Breathing is complicated by sinus drainage  DOes get exhausted  Had ti sit down every 10 min    No energy    Diabetic x 3 years  No numbness     Current Meds  Medication Sig  . azelastine (ASTELIN) 0.1 % nasal spray Place 2 sprays into both nostrils 2 (two) times daily. Use in each nostril as directed  . cetirizine (ZYRTEC) 10 MG tablet Take 10 mg by mouth daily.  . fenofibrate 160 MG tablet Take 160 mg by mouth daily.  . fluticasone (FLONASE) 50 MCG/ACT nasal spray Place 2 sprays into the nose daily.  Marland Kitchen gabapentin (NEURONTIN) 300 MG capsule Take 1 capsule (300 mg total) by mouth at bedtime.  Marland Kitchen glimepiride (AMARYL) 4 MG tablet Take 4 mg by mouth daily with breakfast.  . HYDROcodone-acetaminophen (NORCO/VICODIN) 5-325 MG per tablet Take 1 tablet by mouth every 8 (eight) hours as needed for pain.  Marland Kitchen ibuprofen (ADVIL,MOTRIN) 200 MG tablet Take 600 mg by mouth every 6 (six) hours as needed for pain.  Marland Kitchen JARDIANCE 10 MG TABS tablet 10 mg daily.  . pantoprazole (PROTONIX) 40 MG tablet Take 40 mg by mouth daily.  . sildenafil (VIAGRA) 100 MG tablet Take 100 mg by mouth daily as needed for erectile dysfunction.  Marland Kitchen tiZANidine (ZANAFLEX) 2 MG tablet Take by mouth at bedtime.  . traMADol (ULTRAM) 50 MG tablet  Take by mouth every 6 (six) hours as needed.     Allergies:   Erythromycin; Hydrocodone bitartrate er; Keflex [cephalexin]; Metformin and related; and Pseudoephedrine   Past Medical History:  Diagnosis Date  . Acid reflux   . Amputation finger    right index finger  . Anxiety   . Depression   . Diabetes mellitus without complication ([place])   . Dyslipidemia   . Fatigue   . Hyperlipidemia   . Impotence   . Low back pain   . Sinusitis   . Spastic colon     Past Surgical History:  Procedure Laterality Date  . NECK SURGERY       Social History:  The patient  reports that he has never smoked. He has never used smokeless tobacco. He reports that he does not drink alcohol or use drugs.   Family History:  The patient's family history includes Heart disease in his father.    ROS:  Please see the history of present illness. All other systems are reviewed and  Negative to the above problem except as noted.    PHYSICAL EXAM: VS:  BP 132/70   Pulse (!) 49   Ht  6\' 2"  (1.88 m)   Wt 191 lb (86.6 kg)   BMI 24.52 kg/m   GEN: Well nourished, well developed, in no acute distress  HEENT: normal  Neck: no JVD, carotid bruits, or masses Cardiac: RRR; no murmurs, rubs, or gallops,no edema  Respiratory:  clear to auscultation bilaterally, normal work of breathing GI: soft, nontender, nondistended, + BS  No hepatomegaly  MS: no deformity Moving all extremities   Skin: warm and dry, no rash Neuro:  Strength and sensation are intact Psych: euthymic mood, full affect   EKG:  EKG is ordered today.  Sinus bradycardia  50 bpm  First degree AV block  PR 208 msec     Lipid Panel No results found for: CHOL, TRIG, HDL, CHOLHDL, VLDL, LDLCALC, LDLDIRECT    Wt Readings from Last 3 Encounters:  03/12/17 191 lb (86.6 kg)  03/02/17 194 lb 3.2 oz (88.1 kg)  08/26/16 201 lb (91.2 kg)      ASSESSMENT AND PLAN:  1  Bradycardia  HR is slow  BP is OK however. Will need to correlate symptoms  with HR  I have recommm that the pt wear a 48 hour holter montior  With the SOB, fatigue and his age, medical history I also would recomm a Lexiscan myovue to r/o inducible ischemia  F/U based on test results     Current medicines are reviewed at length with the patient today.  The patient does not have concerns regarding medicines.  Signed, Dorris Carnes, MD  03/12/2017 3:23 PM    [facility] [address], [place], [place]  [address] Phone: [phone]; Fax: [phone]

## 2017-03-14 DIAGNOSIS — R001 Bradycardia, unspecified: Secondary | ICD-10-CM

## 2017-03-15 ENCOUNTER — Ambulatory Visit (HOSPITAL_COMMUNITY): Payer: Medicare HMO | Attending: Cardiology

## 2017-03-15 DIAGNOSIS — R5383 Other fatigue: Secondary | ICD-10-CM | POA: Diagnosis not present

## 2017-03-15 DIAGNOSIS — E785 Hyperlipidemia, unspecified: Secondary | ICD-10-CM | POA: Insufficient documentation

## 2017-03-15 DIAGNOSIS — R0602 Shortness of breath: Secondary | ICD-10-CM | POA: Insufficient documentation

## 2017-03-15 DIAGNOSIS — E119 Type 2 diabetes mellitus without complications: Secondary | ICD-10-CM | POA: Insufficient documentation

## 2017-03-15 LAB — MYOCARDIAL PERFUSION IMAGING
CHL CUP NUCLEAR SDS: 2
CHL CUP NUCLEAR SRS: 7
CHL CUP RESTING HR STRESS: 47 {beats}/min
LHR: 0.29
LV dias vol: 150 mL (ref 62–150)
LV sys vol: 73 mL
NUC STRESS TID: 1.05
Peak HR: 56 {beats}/min
SSS: 8

## 2017-03-15 MED ORDER — TECHNETIUM TC 99M TETROFOSMIN IV KIT
32.0000 | PACK | Freq: Once | INTRAVENOUS | Status: AC | PRN
  Administered 2017-03-15: 32 via INTRAVENOUS
  Filled 2017-03-15: qty 32

## 2017-03-15 MED ORDER — REGADENOSON 0.4 MG/5ML IV SOLN
0.4000 mg | Freq: Once | INTRAVENOUS | Status: AC
  Administered 2017-03-15: 0.4 mg via INTRAVENOUS

## 2017-03-15 MED ORDER — TECHNETIUM TC 99M TETROFOSMIN IV KIT
10.8000 | PACK | Freq: Once | INTRAVENOUS | Status: AC | PRN
Start: 2017-03-15 — End: 2017-03-15
  Administered 2017-03-15: 10.8 via INTRAVENOUS
  Filled 2017-03-15: qty 11

## 2017-04-14 DIAGNOSIS — J0110 Acute frontal sinusitis, unspecified: Secondary | ICD-10-CM | POA: Diagnosis not present

## 2017-04-14 DIAGNOSIS — F419 Anxiety disorder, unspecified: Secondary | ICD-10-CM | POA: Diagnosis not present

## 2017-04-14 DIAGNOSIS — E785 Hyperlipidemia, unspecified: Secondary | ICD-10-CM | POA: Diagnosis not present

## 2017-04-14 DIAGNOSIS — R5383 Other fatigue: Secondary | ICD-10-CM | POA: Diagnosis not present

## 2017-04-14 DIAGNOSIS — E119 Type 2 diabetes mellitus without complications: Secondary | ICD-10-CM | POA: Diagnosis not present

## 2017-04-14 DIAGNOSIS — J309 Allergic rhinitis, unspecified: Secondary | ICD-10-CM | POA: Diagnosis not present

## 2017-07-27 DIAGNOSIS — Z23 Encounter for immunization: Secondary | ICD-10-CM | POA: Diagnosis not present

## 2017-07-27 DIAGNOSIS — E785 Hyperlipidemia, unspecified: Secondary | ICD-10-CM | POA: Diagnosis not present

## 2017-07-27 DIAGNOSIS — E119 Type 2 diabetes mellitus without complications: Secondary | ICD-10-CM | POA: Diagnosis not present

## 2017-07-27 DIAGNOSIS — F419 Anxiety disorder, unspecified: Secondary | ICD-10-CM | POA: Diagnosis not present

## 2017-08-30 ENCOUNTER — Ambulatory Visit: Payer: Medicare HMO | Admitting: Neurology

## 2017-09-02 ENCOUNTER — Other Ambulatory Visit: Payer: Self-pay | Admitting: Neurology

## 2017-10-19 ENCOUNTER — Ambulatory Visit: Payer: Medicare HMO | Admitting: Neurology

## 2017-11-08 ENCOUNTER — Ambulatory Visit: Payer: Medicare HMO | Admitting: Neurology

## 2017-11-29 DIAGNOSIS — E1169 Type 2 diabetes mellitus with other specified complication: Secondary | ICD-10-CM | POA: Diagnosis not present

## 2017-11-29 DIAGNOSIS — J301 Allergic rhinitis due to pollen: Secondary | ICD-10-CM | POA: Diagnosis not present

## 2017-11-29 DIAGNOSIS — M5481 Occipital neuralgia: Secondary | ICD-10-CM | POA: Diagnosis not present

## 2017-11-29 DIAGNOSIS — K069 Disorder of gingiva and edentulous alveolar ridge, unspecified: Secondary | ICD-10-CM | POA: Diagnosis not present

## 2017-11-29 DIAGNOSIS — R5383 Other fatigue: Secondary | ICD-10-CM | POA: Diagnosis not present

## 2017-12-28 DIAGNOSIS — R21 Rash and other nonspecific skin eruption: Secondary | ICD-10-CM | POA: Diagnosis not present

## 2017-12-28 DIAGNOSIS — Z23 Encounter for immunization: Secondary | ICD-10-CM | POA: Diagnosis not present

## 2017-12-28 DIAGNOSIS — E785 Hyperlipidemia, unspecified: Secondary | ICD-10-CM | POA: Diagnosis not present

## 2017-12-28 DIAGNOSIS — Z0000 Encounter for general adult medical examination without abnormal findings: Secondary | ICD-10-CM | POA: Diagnosis not present

## 2017-12-28 DIAGNOSIS — E1169 Type 2 diabetes mellitus with other specified complication: Secondary | ICD-10-CM | POA: Diagnosis not present

## 2017-12-28 DIAGNOSIS — K219 Gastro-esophageal reflux disease without esophagitis: Secondary | ICD-10-CM | POA: Diagnosis not present

## 2017-12-28 DIAGNOSIS — F419 Anxiety disorder, unspecified: Secondary | ICD-10-CM | POA: Diagnosis not present

## 2017-12-28 DIAGNOSIS — M7989 Other specified soft tissue disorders: Secondary | ICD-10-CM | POA: Diagnosis not present

## 2017-12-28 DIAGNOSIS — M5481 Occipital neuralgia: Secondary | ICD-10-CM | POA: Diagnosis not present

## 2017-12-29 ENCOUNTER — Other Ambulatory Visit: Payer: Self-pay | Admitting: Family Medicine

## 2017-12-29 DIAGNOSIS — M7989 Other specified soft tissue disorders: Secondary | ICD-10-CM

## 2018-01-14 ENCOUNTER — Ambulatory Visit
Admission: RE | Admit: 2018-01-14 | Discharge: 2018-01-14 | Disposition: A | Discharge status: Home / Self Care | Payer: Medicare HMO | Source: Ambulatory Visit | Attending: Family Medicine | Admitting: Family Medicine

## 2018-01-14 DIAGNOSIS — R229 Localized swelling, mass and lump, unspecified: Secondary | ICD-10-CM | POA: Diagnosis not present

## 2018-01-14 DIAGNOSIS — M7989 Other specified soft tissue disorders: Secondary | ICD-10-CM

## 2018-01-18 ENCOUNTER — Encounter: Payer: Self-pay | Admitting: Neurology

## 2018-01-18 ENCOUNTER — Ambulatory Visit: Payer: Medicare HMO | Admitting: Neurology

## 2018-01-18 VITALS — BP 118/67 | HR 53 | Ht 73.0 in | Wt 202.0 lb

## 2018-01-18 DIAGNOSIS — G518 Other disorders of facial nerve: Secondary | ICD-10-CM | POA: Diagnosis not present

## 2018-01-18 DIAGNOSIS — B353 Tinea pedis: Secondary | ICD-10-CM | POA: Diagnosis not present

## 2018-01-18 DIAGNOSIS — D225 Melanocytic nevi of trunk: Secondary | ICD-10-CM | POA: Diagnosis not present

## 2018-01-18 MED ORDER — GABAPENTIN 300 MG PO CAPS: 300.0000 mg | ORAL_CAPSULE | Freq: Every day | ORAL | 3 refills | Status: AC

## 2018-01-18 NOTE — Progress Notes (Signed)
Subjective:    Patient ID: Ivan Flores is a 83 y.o. male.  HPI     Interim history:   Ivan Flores is an 83 year old right-handed gentleman with an underlying medical history of reflux disease, hyperlipidemia, type 2 diabetes, allergic rhinitis, irritable bowel syndrome, history of sinusitis, anxiety, depression, low back pain, cervical spinal stenosis with s/p ACDF C5/6 and C6/7 in 11/2010 under Dr. Ronnald Ramp, and overweight state, who presents for follow-up consultation of his left-sided facial and head pain as well as paresthesias. The patient is unaccompanied today. I last saw him on 08/26/2016, at which time he reported feeling better on gabapentin 300 mg daily at night but had some daytime tiredness from it. He was advised to follow-up with NP.   I ordered a brain MRI. He had a brain MRI with and without contrast on 09/07/2016 and I reviewed the results: IMPRESSION:  Mildly abnormal MRI brain (with and without) demonstrating: 1. Mild scattered periventricular and subcortical foci of non-specific gliosis, likely chronic small vessel ischemic disease.  2. No acute findings.  We called him with his test results.   He saw Ivan Browner, NP, in the interim on 03/02/2017, at which time he felt stable on gabapentin 300 mg at night.   Today, 01/18/2018 (all dictated new, as well as above notes, some dictation done in note pad or Word, outside of chart, may appear as copied):   He reports doing fairly well from the headache and facial pain standpoint. He is still on gabapentin 300 mg strength one at night. He seems to tolerate it okay. No SEs reported, some intermittent neck discomfort. No recent balance issues, no falls, had blood work through primary care physician which was per his report fine. Tries to stay active, does not always hydrate well enough he admits. Last A1c was 7.4 per his report. He has an appointment with dermatology coming up today.   The patient's allergies, current  medications, family history, past medical history, past social history, past surgical history and problem list were reviewed and updated as appropriate.    Previously (copied from previous notes for reference):   I first met him on 05/27/2016 at the request of his primary care physician, at which time he reported a several week history of intermittent abnormal sensations on the left side of his scalp and head including painful jolts, concern for neuralgic pain. He was on symptomatic treatment with gabapentin. I suggested we proceed with a brain MRI, but he did not have it done.   05/27/2016: He reports abnormal sensations in his scalp area on the left and painful jolts of pain on the L side of the scalp for the past 6-8 weeks, started suddenly and short-lived, no associated neurologic  accompaniments, initially, multiple times per day, daily. No prodromal illness, no rash, no shingles, no injuries, but has had prior head injuries in the distant past. No recent dermatological procedure.   I reviewed your office note from 04/20/2016. He was symptomatically started on a trial of gabapentin 300 mg qHS, which was helpful with time, still taking it, and denies side effects, with the exception of mild balance issues lately noted. Has residual soreness feeling, no daily jolts. Somewhat sensitive to touch, not waking him up from sleep, but sleep was disrupted before. He had blood work this year in your office which I reviewed. He had a CBC with differential on 11/04/2015 which was unremarkable, CMP was unremarkable with the exception of glucose level at 134, TSH was  in the normal range, lipid panel showed total cholesterol of 172, triglycerides at 127, LDL borderline at 102. Urinalysis was negative last year in November. A1c in November 2016 was 5.5. The patient's allergies, current medications, family history, past medical history, past social history, past surgical history and problem list were reviewed and  updated as appropriate.   His Past Medical History Is Significant For: Past Medical History:  Diagnosis Date  . Acid reflux   . Amputation finger    right index finger  . Anxiety   . Depression   . Diabetes mellitus without complication ([place])   . Dyslipidemia   . Fatigue   . Hyperlipidemia   . Impotence   . Low back pain   . Sinusitis   . Spastic colon     His Past Surgical History Is Significant For: Past Surgical History:  Procedure Laterality Date  . NECK SURGERY      His Family History Is Significant For: Family History  Problem Relation Age of Onset  . Heart disease Father     His Social History Is Significant For: Social History   Socioeconomic History  . Marital status: Married    Spouse name: Not on file  . Number of children: Not on file  . Years of education: Not on file  . Highest education level: Not on file  Occupational History  . Not on file  Social Needs  . Financial resource strain: Not on file  . Food insecurity:    Worry: Not on file    Inability: Not on file  . Transportation needs:    Medical: Not on file    Non-medical: Not on file  Tobacco Use  . Smoking status: Never Smoker  . Smokeless tobacco: Never Used  Substance and Sexual Activity  . Alcohol use: No  . Drug use: No  . Sexual activity: Not on file  Lifestyle  . Physical activity:    Days per week: Not on file    Minutes per session: Not on file  . Stress: Not on file  Relationships  . Social connections:    Talks on phone: Not on file    Gets together: Not on file    Attends religious service: Not on file    Active member of club or organization: Not on file    Attends meetings of clubs or organizations: Not on file    Relationship status: Not on file  Other Topics Concern  . Not on file  Social History Narrative  . Not on file    His Allergies Are:  Allergies  Allergen Reactions  . Erythromycin   . Hydrocodone Bitartrate   . Keflex [Cephalexin]   .  Metformin And Related   . Pseudoephedrine   :   His Current Medications Are:  Outpatient Encounter Medications as of 01/18/2018  Medication Sig  . azelastine (ASTELIN) 0.1 % nasal spray Place 2 sprays into both nostrils 2 (two) times daily. Use in each nostril as directed  . cetirizine (ZYRTEC) 10 MG tablet Take 10 mg by mouth daily.  . fenofibrate 160 MG tablet Take 160 mg by mouth daily.  . fluticasone (FLONASE) 50 MCG/ACT nasal spray Place 2 sprays into the nose daily.  Marland Kitchen gabapentin (NEURONTIN) 300 MG capsule TAKE 1 CAPSULE AT BEDTIME  . glimepiride (AMARYL) 4 MG tablet Take 4 mg by mouth daily with breakfast.  . ibuprofen (ADVIL,MOTRIN) 200 MG tablet Take 600 mg by mouth every 6 (six) hours as needed for  pain.  Marland Kitchen JARDIANCE 10 MG TABS tablet 10 mg daily.  . pantoprazole (PROTONIX) 40 MG tablet Take 40 mg by mouth daily.  . sildenafil (VIAGRA) 100 MG tablet Take 100 mg by mouth daily as needed for erectile dysfunction.  Marland Kitchen tiZANidine (ZANAFLEX) 2 MG tablet Take by mouth at bedtime.  . traMADol (ULTRAM) 50 MG tablet Take by mouth every 6 (six) hours as needed.  . [DISCONTINUED] HYDROcodone-acetaminophen (NORCO/VICODIN) 5-325 MG per tablet Take 1 tablet by mouth every 8 (eight) hours as needed for pain.   No facility-administered encounter medications on file as of 01/18/2018.   :  Review of Systems:  Out of a complete 14 point review of systems, all are reviewed and negative with the exception of these symptoms as listed below: Review of Systems  Neurological:       Pt presents today to discuss his headaches. Pt reports that his headaches have been doing well recently.    Objective:  Neurological Exam  Physical Exam Physical Examination:   Vitals:   01/18/18 0919  BP: 118/67  Pulse: (!) 53   General Examination: The patient is a very pleasant 83 y.o. male in no acute distress. He appears well-developed and well-nourished and well groomed.   HEENT: Normocephalic, atraumatic,  pupils are equal, round and reactive to light and accommodation. No lesions on the scalp, has prior scars, on the R parietal area and frontal areas from prior/old injuries. Nothing new. Corrective eye glasses. Extraocular tracking is good without limitation to gaze excursion or nystagmus noted. Normal smooth pursuit is noted. Hearing is grossly intact, subjectively less on the R. Face is symmetric with normal facial animation and normal facial sensation. Speech is clear with no dysarthria noted. There is no hypophonia. There is no lip, neck/head, jaw or voice tremor. Neck with FROM, unremarkable scar R ant neck. Oropharynx exam reveals: mild mouth dryness, adequate dental hygiene and mild airway crowding, due to redundant soft palate. Mallampati is class II. Tongue protrudes centrally and palate elevates symmetrically. Tonsils are absent.   Chest: Clear to auscultation without wheezing, rhonchi or crackles noted.  Heart: S1+S2+0, regular and normal without murmurs, rubs or gallops noted.   Abdomen: Soft, non-tender and non-distended with normal bowel sounds appreciated on auscultation.  Extremities: There is no pitting edema in the distal lower extremities bilaterally.  Skin: Warm and dry without trophic changes noted.  Musculoskeletal: exam reveals no obvious joint deformities, tenderness or joint swelling or erythema.   Neurologically:  Mental status: The patient is awake, alert and oriented in all 4 spheres. His immediate and remote memory, attention, language skills and fund of knowledge are appropriate. There is no evidence of aphasia, agnosia, apraxia or anomia. Speech is clear with normal prosody and enunciation. Thought process is linear. Mood is normal and affect is normal.  Cranial nerves II - XII are as described above under HEENT exam. In addition: shoulder shrug is normal with R shoulder slightly higher.  Motor exam: Normal bulk, strength and tone is noted. There is no drift,  tremor or rebound. Romberg is negative with some sway. Reflexes are 1-2+ in the UEs an 1+ in the knees, absent in the ankles. Fine motor skills and coordination: grossly intact.  Cerebellar testing: No dysmetria or intention tremor. There is no truncal or gait ataxia.  Sensory exam: intact to light touch in the upper and lower extremities.  Gait, station and balance: He stands easily. No veering to one side is noted. No leaning to  one side is noted. Posture is age-appropriate and stance is narrow based. Gait shows normal stride length and normal pace. No problems turning are noted. Tandem walk is not possible safely.    Assessment and Plan:   In summary, ARDITH LEWMAN is a very pleasant 83 year old male with an underlying medical history of reflux disease, hyperlipidemia, type 2 diabetes, allergic rhinitis, irritable bowel syndrome, history of sinusitis, anxiety, depression, low back pain, and overweight state, who presents for follow-up consultation of his left-sided scalp pain/facial pain and resultant intermittent headaches. Brain MRI from March 2018 was nonrevealing, fairly age-appropriate. He has done fairly well with gabapentin low dose, 300 mg each night. He has not experience any significant side effects and is encouraged to continue with the current dose and timing. I renewed his prescription for 90 days with refills. His exam is stable. He is encouraged to drink more water to stay better hydrated. At this juncture, he is advised to follow-up in one year, he can see Ivan Flores again next time. I answered all his questions today and he was in agreement.  I spent 25 minutes in total face-to-face time with the patient, more than 50% of which was spent in counseling and coordination of care, reviewing test results, reviewing medication and discussing or reviewing the diagnosis of facial pain, its prognosis and treatment options. Pertinent laboratory and imaging test results that were  available during this visit with the patient were reviewed by me and considered in my medical decision making (see chart for details).

## 2018-01-18 NOTE — Patient Instructions (Addendum)
I think you are stable on the current dose of low dose gapapentin. I would recommend you continue with gabapentin 300 mg at bedtime. Follow up with Ward Givens in one year.

## 2018-03-02 DIAGNOSIS — Z23 Encounter for immunization: Secondary | ICD-10-CM | POA: Diagnosis not present

## 2018-03-02 DIAGNOSIS — M5481 Occipital neuralgia: Secondary | ICD-10-CM | POA: Diagnosis not present

## 2018-03-02 DIAGNOSIS — E1165 Type 2 diabetes mellitus with hyperglycemia: Secondary | ICD-10-CM | POA: Diagnosis not present

## 2018-03-02 DIAGNOSIS — J0110 Acute frontal sinusitis, unspecified: Secondary | ICD-10-CM | POA: Diagnosis not present

## 2018-05-05 DIAGNOSIS — S61411A Laceration without foreign body of right hand, initial encounter: Secondary | ICD-10-CM | POA: Diagnosis not present

## 2018-05-05 DIAGNOSIS — E785 Hyperlipidemia, unspecified: Secondary | ICD-10-CM | POA: Diagnosis not present

## 2018-05-05 DIAGNOSIS — K068 Other specified disorders of gingiva and edentulous alveolar ridge: Secondary | ICD-10-CM | POA: Diagnosis not present

## 2018-05-05 DIAGNOSIS — E1169 Type 2 diabetes mellitus with other specified complication: Secondary | ICD-10-CM | POA: Diagnosis not present

## 2018-05-05 DIAGNOSIS — F419 Anxiety disorder, unspecified: Secondary | ICD-10-CM | POA: Diagnosis not present

## 2018-06-29 DIAGNOSIS — D3130 Benign neoplasm of unspecified choroid: Secondary | ICD-10-CM | POA: Diagnosis not present

## 2018-06-29 DIAGNOSIS — E119 Type 2 diabetes mellitus without complications: Secondary | ICD-10-CM | POA: Diagnosis not present

## 2018-06-29 DIAGNOSIS — Z0100 Encounter for examination of eyes and vision without abnormal findings: Secondary | ICD-10-CM | POA: Diagnosis not present

## 2018-06-29 DIAGNOSIS — H35372 Puckering of macula, left eye: Secondary | ICD-10-CM | POA: Diagnosis not present

## 2018-06-29 DIAGNOSIS — H2513 Age-related nuclear cataract, bilateral: Secondary | ICD-10-CM | POA: Diagnosis not present

## 2018-07-05 IMAGING — NM NM MISC PROCEDURE
3 series · 18 of 18 positions shown · non-contrast
Comparison: none

[Series 1: wbr_s-proj_st stress_(id)_sa · 6.5mm · 6.51mm/px · 6 of 64 frames shown (1 of 2)]
[frame 6/64]
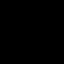
[frame 16/64]
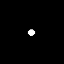
[frame 27/64]
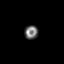
[frame 38/64]
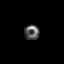
[frame 48/64]
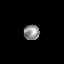
[frame 59/64]
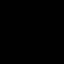

[Series 1: wbr_s-proj_st stress_(id)_sa · 6.5mm · 6.51mm/px · 6 of 512 frames shown (2 of 2)]
[frame 43/512]
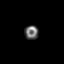
[frame 128/512]
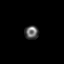
[frame 214/512]
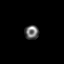
[frame 299/512]
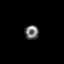
[frame 384/512]
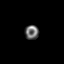
[frame 470/512]
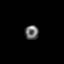

[Series 1: wbr_r-proj_st rest_(id)_sa · 6.5mm · 6.51mm/px · 6 of 64 frames shown]
[frame 6/64]
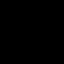
[frame 16/64]
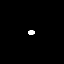
[frame 27/64]
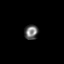
[frame 38/64]
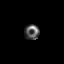
[frame 48/64]
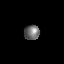
[frame 59/64]
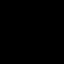

[18 of 18 positions shown; findings below may reference images not displayed]

Canned report from images found in remote index.

Refer to host system for actual result text.

## 2018-07-20 DIAGNOSIS — S62600A Fracture of unspecified phalanx of right index finger, initial encounter for closed fracture: Secondary | ICD-10-CM | POA: Diagnosis not present

## 2018-07-20 DIAGNOSIS — M79641 Pain in right hand: Secondary | ICD-10-CM | POA: Diagnosis not present

## 2018-07-21 DIAGNOSIS — S63650A Sprain of metacarpophalangeal joint of right index finger, initial encounter: Secondary | ICD-10-CM | POA: Diagnosis not present

## 2018-08-11 DIAGNOSIS — S63650D Sprain of metacarpophalangeal joint of right index finger, subsequent encounter: Secondary | ICD-10-CM | POA: Diagnosis not present

## 2018-09-02 DIAGNOSIS — E785 Hyperlipidemia, unspecified: Secondary | ICD-10-CM | POA: Diagnosis not present

## 2018-09-02 DIAGNOSIS — R109 Unspecified abdominal pain: Secondary | ICD-10-CM | POA: Diagnosis not present

## 2018-09-02 DIAGNOSIS — J301 Allergic rhinitis due to pollen: Secondary | ICD-10-CM | POA: Diagnosis not present

## 2018-09-02 DIAGNOSIS — M5481 Occipital neuralgia: Secondary | ICD-10-CM | POA: Diagnosis not present

## 2018-09-02 DIAGNOSIS — E1169 Type 2 diabetes mellitus with other specified complication: Secondary | ICD-10-CM | POA: Diagnosis not present

## 2018-09-02 DIAGNOSIS — F419 Anxiety disorder, unspecified: Secondary | ICD-10-CM | POA: Diagnosis not present

## 2018-11-23 DIAGNOSIS — S20461A Insect bite (nonvenomous) of right back wall of thorax, initial encounter: Secondary | ICD-10-CM | POA: Diagnosis not present

## 2018-11-23 DIAGNOSIS — E1165 Type 2 diabetes mellitus with hyperglycemia: Secondary | ICD-10-CM | POA: Diagnosis not present

## 2018-11-23 DIAGNOSIS — M6281 Muscle weakness (generalized): Secondary | ICD-10-CM | POA: Diagnosis not present

## 2018-11-23 DIAGNOSIS — W57XXXA Bitten or stung by nonvenomous insect and other nonvenomous arthropods, initial encounter: Secondary | ICD-10-CM | POA: Diagnosis not present

## 2018-11-28 DIAGNOSIS — M6281 Muscle weakness (generalized): Secondary | ICD-10-CM | POA: Diagnosis not present

## 2018-11-28 DIAGNOSIS — M25551 Pain in right hip: Secondary | ICD-10-CM | POA: Diagnosis not present

## 2018-11-28 DIAGNOSIS — R262 Difficulty in walking, not elsewhere classified: Secondary | ICD-10-CM | POA: Diagnosis not present

## 2018-12-02 DIAGNOSIS — M6281 Muscle weakness (generalized): Secondary | ICD-10-CM | POA: Diagnosis not present

## 2018-12-02 DIAGNOSIS — R262 Difficulty in walking, not elsewhere classified: Secondary | ICD-10-CM | POA: Diagnosis not present

## 2018-12-02 DIAGNOSIS — M25551 Pain in right hip: Secondary | ICD-10-CM | POA: Diagnosis not present

## 2018-12-09 DIAGNOSIS — M6281 Muscle weakness (generalized): Secondary | ICD-10-CM | POA: Diagnosis not present

## 2018-12-09 DIAGNOSIS — R262 Difficulty in walking, not elsewhere classified: Secondary | ICD-10-CM | POA: Diagnosis not present

## 2018-12-09 DIAGNOSIS — M25551 Pain in right hip: Secondary | ICD-10-CM | POA: Diagnosis not present

## 2018-12-15 DIAGNOSIS — M25551 Pain in right hip: Secondary | ICD-10-CM | POA: Diagnosis not present

## 2018-12-15 DIAGNOSIS — M6281 Muscle weakness (generalized): Secondary | ICD-10-CM | POA: Diagnosis not present

## 2018-12-15 DIAGNOSIS — R262 Difficulty in walking, not elsewhere classified: Secondary | ICD-10-CM | POA: Diagnosis not present

## 2018-12-28 DIAGNOSIS — R262 Difficulty in walking, not elsewhere classified: Secondary | ICD-10-CM | POA: Diagnosis not present

## 2018-12-28 DIAGNOSIS — M6281 Muscle weakness (generalized): Secondary | ICD-10-CM | POA: Diagnosis not present

## 2018-12-28 DIAGNOSIS — M25551 Pain in right hip: Secondary | ICD-10-CM | POA: Diagnosis not present

## 2019-01-04 ENCOUNTER — Other Ambulatory Visit: Payer: Self-pay | Admitting: Family Medicine

## 2019-01-04 DIAGNOSIS — R29898 Other symptoms and signs involving the musculoskeletal system: Secondary | ICD-10-CM | POA: Diagnosis not present

## 2019-01-04 DIAGNOSIS — Z0000 Encounter for general adult medical examination without abnormal findings: Secondary | ICD-10-CM | POA: Diagnosis not present

## 2019-01-04 DIAGNOSIS — F419 Anxiety disorder, unspecified: Secondary | ICD-10-CM | POA: Diagnosis not present

## 2019-01-04 DIAGNOSIS — J309 Allergic rhinitis, unspecified: Secondary | ICD-10-CM | POA: Diagnosis not present

## 2019-01-04 DIAGNOSIS — K219 Gastro-esophageal reflux disease without esophagitis: Secondary | ICD-10-CM | POA: Diagnosis not present

## 2019-01-04 DIAGNOSIS — M5481 Occipital neuralgia: Secondary | ICD-10-CM | POA: Diagnosis not present

## 2019-01-04 DIAGNOSIS — E785 Hyperlipidemia, unspecified: Secondary | ICD-10-CM | POA: Diagnosis not present

## 2019-01-04 DIAGNOSIS — E1169 Type 2 diabetes mellitus with other specified complication: Secondary | ICD-10-CM | POA: Diagnosis not present

## 2019-01-23 ENCOUNTER — Ambulatory Visit: Payer: Medicare HMO | Admitting: Adult Health

## 2019-02-14 ENCOUNTER — Ambulatory Visit
Admission: RE | Admit: 2019-02-14 | Discharge: 2019-02-14 | Disposition: A | Discharge status: Home / Self Care | Payer: Medicare HMO | Source: Ambulatory Visit | Attending: Family Medicine | Admitting: Family Medicine

## 2019-02-14 ENCOUNTER — Other Ambulatory Visit: Payer: Self-pay

## 2019-02-14 DIAGNOSIS — R29898 Other symptoms and signs involving the musculoskeletal system: Secondary | ICD-10-CM

## 2019-02-14 DIAGNOSIS — M47816 Spondylosis without myelopathy or radiculopathy, lumbar region: Secondary | ICD-10-CM | POA: Diagnosis not present

## 2019-02-14 DIAGNOSIS — M5126 Other intervertebral disc displacement, lumbar region: Secondary | ICD-10-CM | POA: Diagnosis not present

## 2019-02-14 MED ORDER — GADOBENATE DIMEGLUMINE 529 MG/ML IV SOLN
19.0000 mL | Freq: Once | INTRAVENOUS | Status: AC | PRN
  Administered 2019-02-14: 19 mL via INTRAVENOUS

## 2019-02-18 ENCOUNTER — Other Ambulatory Visit: Payer: Medicare HMO

## 2019-02-28 DIAGNOSIS — M5416 Radiculopathy, lumbar region: Secondary | ICD-10-CM | POA: Diagnosis not present

## 2019-05-11 DIAGNOSIS — E1169 Type 2 diabetes mellitus with other specified complication: Secondary | ICD-10-CM | POA: Diagnosis not present

## 2019-05-11 DIAGNOSIS — E785 Hyperlipidemia, unspecified: Secondary | ICD-10-CM | POA: Diagnosis not present

## 2020-01-16 DIAGNOSIS — E1169 Type 2 diabetes mellitus with other specified complication: Secondary | ICD-10-CM | POA: Diagnosis not present

## 2020-01-16 DIAGNOSIS — K219 Gastro-esophageal reflux disease without esophagitis: Secondary | ICD-10-CM | POA: Diagnosis not present

## 2020-01-16 DIAGNOSIS — R946 Abnormal results of thyroid function studies: Secondary | ICD-10-CM | POA: Diagnosis not present

## 2020-01-16 DIAGNOSIS — E785 Hyperlipidemia, unspecified: Secondary | ICD-10-CM | POA: Diagnosis not present

## 2020-01-16 DIAGNOSIS — M5481 Occipital neuralgia: Secondary | ICD-10-CM | POA: Diagnosis not present

## 2020-01-16 DIAGNOSIS — F419 Anxiety disorder, unspecified: Secondary | ICD-10-CM | POA: Diagnosis not present

## 2020-01-16 DIAGNOSIS — Z7984 Long term (current) use of oral hypoglycemic drugs: Secondary | ICD-10-CM | POA: Diagnosis not present

## 2020-01-16 DIAGNOSIS — R197 Diarrhea, unspecified: Secondary | ICD-10-CM | POA: Diagnosis not present

## 2020-05-01 DIAGNOSIS — R946 Abnormal results of thyroid function studies: Secondary | ICD-10-CM | POA: Diagnosis not present

## 2020-05-21 DIAGNOSIS — L218 Other seborrheic dermatitis: Secondary | ICD-10-CM | POA: Diagnosis not present

## 2020-05-21 DIAGNOSIS — L821 Other seborrheic keratosis: Secondary | ICD-10-CM | POA: Diagnosis not present

## 2020-05-21 DIAGNOSIS — L820 Inflamed seborrheic keratosis: Secondary | ICD-10-CM | POA: Diagnosis not present

## 2020-05-27 DIAGNOSIS — R69 Illness, unspecified: Secondary | ICD-10-CM | POA: Diagnosis not present

## 2020-05-30 DIAGNOSIS — D171 Benign lipomatous neoplasm of skin and subcutaneous tissue of trunk: Secondary | ICD-10-CM | POA: Diagnosis not present

## 2020-05-30 DIAGNOSIS — Z7984 Long term (current) use of oral hypoglycemic drugs: Secondary | ICD-10-CM | POA: Diagnosis not present

## 2020-05-30 DIAGNOSIS — R739 Hyperglycemia, unspecified: Secondary | ICD-10-CM | POA: Diagnosis not present

## 2020-05-30 DIAGNOSIS — Z9289 Personal history of other medical treatment: Secondary | ICD-10-CM | POA: Diagnosis not present

## 2020-05-30 DIAGNOSIS — E1169 Type 2 diabetes mellitus with other specified complication: Secondary | ICD-10-CM | POA: Diagnosis not present

## 2020-05-30 DIAGNOSIS — E039 Hypothyroidism, unspecified: Secondary | ICD-10-CM | POA: Diagnosis not present

## 2020-05-30 DIAGNOSIS — M62838 Other muscle spasm: Secondary | ICD-10-CM | POA: Diagnosis not present

## 2020-07-18 DIAGNOSIS — R109 Unspecified abdominal pain: Secondary | ICD-10-CM | POA: Diagnosis not present

## 2020-07-18 DIAGNOSIS — M5481 Occipital neuralgia: Secondary | ICD-10-CM | POA: Diagnosis not present

## 2020-07-18 DIAGNOSIS — E1169 Type 2 diabetes mellitus with other specified complication: Secondary | ICD-10-CM | POA: Diagnosis not present

## 2020-07-18 DIAGNOSIS — F419 Anxiety disorder, unspecified: Secondary | ICD-10-CM | POA: Diagnosis not present

## 2020-07-18 DIAGNOSIS — E785 Hyperlipidemia, unspecified: Secondary | ICD-10-CM | POA: Diagnosis not present

## 2020-07-18 DIAGNOSIS — E039 Hypothyroidism, unspecified: Secondary | ICD-10-CM | POA: Diagnosis not present

## 2020-07-18 DIAGNOSIS — M25361 Other instability, right knee: Secondary | ICD-10-CM | POA: Diagnosis not present

## 2020-07-18 DIAGNOSIS — M25362 Other instability, left knee: Secondary | ICD-10-CM | POA: Diagnosis not present

## 2020-07-18 DIAGNOSIS — R29898 Other symptoms and signs involving the musculoskeletal system: Secondary | ICD-10-CM | POA: Diagnosis not present

## 2020-07-26 DIAGNOSIS — R29898 Other symptoms and signs involving the musculoskeletal system: Secondary | ICD-10-CM | POA: Diagnosis not present

## 2020-09-03 DIAGNOSIS — E785 Hyperlipidemia, unspecified: Secondary | ICD-10-CM | POA: Diagnosis not present

## 2020-09-03 DIAGNOSIS — K219 Gastro-esophageal reflux disease without esophagitis: Secondary | ICD-10-CM | POA: Diagnosis not present

## 2020-09-03 DIAGNOSIS — E039 Hypothyroidism, unspecified: Secondary | ICD-10-CM | POA: Diagnosis not present

## 2020-09-03 DIAGNOSIS — E1169 Type 2 diabetes mellitus with other specified complication: Secondary | ICD-10-CM | POA: Diagnosis not present

## 2020-09-04 DIAGNOSIS — E039 Hypothyroidism, unspecified: Secondary | ICD-10-CM | POA: Diagnosis not present

## 2020-09-16 DIAGNOSIS — R29898 Other symptoms and signs involving the musculoskeletal system: Secondary | ICD-10-CM | POA: Diagnosis not present

## 2020-10-16 DIAGNOSIS — E1169 Type 2 diabetes mellitus with other specified complication: Secondary | ICD-10-CM | POA: Diagnosis not present

## 2020-10-16 DIAGNOSIS — E785 Hyperlipidemia, unspecified: Secondary | ICD-10-CM | POA: Diagnosis not present

## 2020-10-16 DIAGNOSIS — M5481 Occipital neuralgia: Secondary | ICD-10-CM | POA: Diagnosis not present

## 2020-10-16 DIAGNOSIS — Z7984 Long term (current) use of oral hypoglycemic drugs: Secondary | ICD-10-CM | POA: Diagnosis not present

## 2020-10-16 DIAGNOSIS — E039 Hypothyroidism, unspecified: Secondary | ICD-10-CM | POA: Diagnosis not present

## 2020-12-18 DIAGNOSIS — E039 Hypothyroidism, unspecified: Secondary | ICD-10-CM | POA: Diagnosis not present

## 2020-12-18 DIAGNOSIS — E785 Hyperlipidemia, unspecified: Secondary | ICD-10-CM | POA: Diagnosis not present

## 2020-12-18 DIAGNOSIS — E1169 Type 2 diabetes mellitus with other specified complication: Secondary | ICD-10-CM | POA: Diagnosis not present

## 2020-12-18 DIAGNOSIS — K219 Gastro-esophageal reflux disease without esophagitis: Secondary | ICD-10-CM | POA: Diagnosis not present

## 2021-01-01 DIAGNOSIS — L218 Other seborrheic dermatitis: Secondary | ICD-10-CM | POA: Diagnosis not present

## 2021-01-27 DIAGNOSIS — K219 Gastro-esophageal reflux disease without esophagitis: Secondary | ICD-10-CM | POA: Diagnosis not present

## 2021-01-27 DIAGNOSIS — E039 Hypothyroidism, unspecified: Secondary | ICD-10-CM | POA: Diagnosis not present

## 2021-01-27 DIAGNOSIS — E785 Hyperlipidemia, unspecified: Secondary | ICD-10-CM | POA: Diagnosis not present

## 2021-01-27 DIAGNOSIS — E1169 Type 2 diabetes mellitus with other specified complication: Secondary | ICD-10-CM | POA: Diagnosis not present

## 2021-02-03 DIAGNOSIS — H43392 Other vitreous opacities, left eye: Secondary | ICD-10-CM | POA: Diagnosis not present

## 2021-02-03 DIAGNOSIS — H2513 Age-related nuclear cataract, bilateral: Secondary | ICD-10-CM | POA: Diagnosis not present

## 2021-02-03 DIAGNOSIS — H35372 Puckering of macula, left eye: Secondary | ICD-10-CM | POA: Diagnosis not present

## 2021-02-03 DIAGNOSIS — D3132 Benign neoplasm of left choroid: Secondary | ICD-10-CM | POA: Diagnosis not present

## 2021-02-03 DIAGNOSIS — E119 Type 2 diabetes mellitus without complications: Secondary | ICD-10-CM | POA: Diagnosis not present

## 2021-02-17 DIAGNOSIS — Z7984 Long term (current) use of oral hypoglycemic drugs: Secondary | ICD-10-CM | POA: Diagnosis not present

## 2021-02-17 DIAGNOSIS — M25511 Pain in right shoulder: Secondary | ICD-10-CM | POA: Diagnosis not present

## 2021-02-17 DIAGNOSIS — F419 Anxiety disorder, unspecified: Secondary | ICD-10-CM | POA: Diagnosis not present

## 2021-02-17 DIAGNOSIS — E1169 Type 2 diabetes mellitus with other specified complication: Secondary | ICD-10-CM | POA: Diagnosis not present

## 2021-02-17 DIAGNOSIS — E785 Hyperlipidemia, unspecified: Secondary | ICD-10-CM | POA: Diagnosis not present

## 2021-02-17 DIAGNOSIS — J309 Allergic rhinitis, unspecified: Secondary | ICD-10-CM | POA: Diagnosis not present

## 2021-02-17 DIAGNOSIS — E039 Hypothyroidism, unspecified: Secondary | ICD-10-CM | POA: Diagnosis not present

## 2021-02-17 DIAGNOSIS — M5481 Occipital neuralgia: Secondary | ICD-10-CM | POA: Diagnosis not present

## 2021-02-17 DIAGNOSIS — R194 Change in bowel habit: Secondary | ICD-10-CM | POA: Diagnosis not present

## 2021-03-12 DIAGNOSIS — Z7984 Long term (current) use of oral hypoglycemic drugs: Secondary | ICD-10-CM | POA: Diagnosis not present

## 2021-03-12 DIAGNOSIS — M25511 Pain in right shoulder: Secondary | ICD-10-CM | POA: Diagnosis not present

## 2021-03-12 DIAGNOSIS — E1169 Type 2 diabetes mellitus with other specified complication: Secondary | ICD-10-CM | POA: Diagnosis not present

## 2021-03-14 DIAGNOSIS — E1169 Type 2 diabetes mellitus with other specified complication: Secondary | ICD-10-CM | POA: Diagnosis not present

## 2021-03-14 DIAGNOSIS — E039 Hypothyroidism, unspecified: Secondary | ICD-10-CM | POA: Diagnosis not present

## 2021-03-14 DIAGNOSIS — K219 Gastro-esophageal reflux disease without esophagitis: Secondary | ICD-10-CM | POA: Diagnosis not present

## 2021-03-14 DIAGNOSIS — E785 Hyperlipidemia, unspecified: Secondary | ICD-10-CM | POA: Diagnosis not present

## 2021-05-12 DIAGNOSIS — K580 Irritable bowel syndrome with diarrhea: Secondary | ICD-10-CM | POA: Diagnosis not present

## 2021-06-05 DIAGNOSIS — K219 Gastro-esophageal reflux disease without esophagitis: Secondary | ICD-10-CM | POA: Diagnosis not present

## 2021-06-05 DIAGNOSIS — E1169 Type 2 diabetes mellitus with other specified complication: Secondary | ICD-10-CM | POA: Diagnosis not present

## 2021-06-05 DIAGNOSIS — E785 Hyperlipidemia, unspecified: Secondary | ICD-10-CM | POA: Diagnosis not present

## 2021-06-05 DIAGNOSIS — E039 Hypothyroidism, unspecified: Secondary | ICD-10-CM | POA: Diagnosis not present

## 2021-07-11 ENCOUNTER — Other Ambulatory Visit: Payer: Self-pay | Admitting: Family Medicine

## 2021-07-11 ENCOUNTER — Ambulatory Visit
Admission: RE | Admit: 2021-07-11 | Discharge: 2021-07-11 | Disposition: A | Discharge status: Home / Self Care | Payer: Medicare HMO | Source: Ambulatory Visit | Attending: Family Medicine | Admitting: Family Medicine

## 2021-07-11 ENCOUNTER — Other Ambulatory Visit: Payer: Self-pay

## 2021-07-11 DIAGNOSIS — Z0000 Encounter for general adult medical examination without abnormal findings: Secondary | ICD-10-CM | POA: Diagnosis not present

## 2021-07-11 DIAGNOSIS — E1169 Type 2 diabetes mellitus with other specified complication: Secondary | ICD-10-CM | POA: Diagnosis not present

## 2021-07-11 DIAGNOSIS — S93402A Sprain of unspecified ligament of left ankle, initial encounter: Secondary | ICD-10-CM | POA: Diagnosis not present

## 2021-07-11 DIAGNOSIS — E785 Hyperlipidemia, unspecified: Secondary | ICD-10-CM | POA: Diagnosis not present

## 2021-07-11 DIAGNOSIS — K219 Gastro-esophageal reflux disease without esophagitis: Secondary | ICD-10-CM | POA: Diagnosis not present

## 2021-07-11 DIAGNOSIS — Z7984 Long term (current) use of oral hypoglycemic drugs: Secondary | ICD-10-CM | POA: Diagnosis not present

## 2021-07-11 DIAGNOSIS — M19072 Primary osteoarthritis, left ankle and foot: Secondary | ICD-10-CM | POA: Diagnosis not present

## 2021-07-11 DIAGNOSIS — M5481 Occipital neuralgia: Secondary | ICD-10-CM | POA: Diagnosis not present

## 2021-07-11 DIAGNOSIS — K580 Irritable bowel syndrome with diarrhea: Secondary | ICD-10-CM | POA: Diagnosis not present

## 2021-07-11 DIAGNOSIS — E039 Hypothyroidism, unspecified: Secondary | ICD-10-CM | POA: Diagnosis not present

## 2021-07-11 DIAGNOSIS — F419 Anxiety disorder, unspecified: Secondary | ICD-10-CM | POA: Diagnosis not present

## 2021-10-14 DIAGNOSIS — E785 Hyperlipidemia, unspecified: Secondary | ICD-10-CM | POA: Diagnosis not present

## 2021-10-14 DIAGNOSIS — E1169 Type 2 diabetes mellitus with other specified complication: Secondary | ICD-10-CM | POA: Diagnosis not present

## 2021-10-14 DIAGNOSIS — K219 Gastro-esophageal reflux disease without esophagitis: Secondary | ICD-10-CM | POA: Diagnosis not present

## 2021-10-14 DIAGNOSIS — E039 Hypothyroidism, unspecified: Secondary | ICD-10-CM | POA: Diagnosis not present

## 2021-11-11 DIAGNOSIS — E785 Hyperlipidemia, unspecified: Secondary | ICD-10-CM | POA: Diagnosis not present

## 2021-11-11 DIAGNOSIS — K219 Gastro-esophageal reflux disease without esophagitis: Secondary | ICD-10-CM | POA: Diagnosis not present

## 2021-11-11 DIAGNOSIS — E039 Hypothyroidism, unspecified: Secondary | ICD-10-CM | POA: Diagnosis not present

## 2021-11-11 DIAGNOSIS — Z79899 Other long term (current) drug therapy: Secondary | ICD-10-CM | POA: Diagnosis not present

## 2021-11-11 DIAGNOSIS — R609 Edema, unspecified: Secondary | ICD-10-CM | POA: Diagnosis not present

## 2021-11-11 DIAGNOSIS — R140 Abdominal distension (gaseous): Secondary | ICD-10-CM | POA: Diagnosis not present

## 2021-11-11 DIAGNOSIS — K068 Other specified disorders of gingiva and edentulous alveolar ridge: Secondary | ICD-10-CM | POA: Diagnosis not present

## 2021-11-11 DIAGNOSIS — E1169 Type 2 diabetes mellitus with other specified complication: Secondary | ICD-10-CM | POA: Diagnosis not present

## 2021-11-12 DIAGNOSIS — E785 Hyperlipidemia, unspecified: Secondary | ICD-10-CM | POA: Diagnosis not present

## 2021-11-12 DIAGNOSIS — E1169 Type 2 diabetes mellitus with other specified complication: Secondary | ICD-10-CM | POA: Diagnosis not present

## 2021-11-12 DIAGNOSIS — E039 Hypothyroidism, unspecified: Secondary | ICD-10-CM | POA: Diagnosis not present

## 2021-11-14 ENCOUNTER — Other Ambulatory Visit: Payer: Self-pay | Admitting: Family Medicine

## 2021-11-14 DIAGNOSIS — R140 Abdominal distension (gaseous): Secondary | ICD-10-CM

## 2021-11-21 ENCOUNTER — Ambulatory Visit
Admission: RE | Admit: 2021-11-21 | Discharge: 2021-11-21 | Disposition: A | Discharge status: Home / Self Care | Payer: Medicare HMO | Source: Ambulatory Visit | Attending: Family Medicine | Admitting: Family Medicine

## 2021-11-21 DIAGNOSIS — M47816 Spondylosis without myelopathy or radiculopathy, lumbar region: Secondary | ICD-10-CM | POA: Diagnosis not present

## 2021-11-21 DIAGNOSIS — K8020 Calculus of gallbladder without cholecystitis without obstruction: Secondary | ICD-10-CM | POA: Diagnosis not present

## 2021-11-21 DIAGNOSIS — K5730 Diverticulosis of large intestine without perforation or abscess without bleeding: Secondary | ICD-10-CM | POA: Diagnosis not present

## 2021-11-21 DIAGNOSIS — R140 Abdominal distension (gaseous): Secondary | ICD-10-CM

## 2021-11-21 DIAGNOSIS — K8689 Other specified diseases of pancreas: Secondary | ICD-10-CM | POA: Diagnosis not present

## 2021-11-21 MED ORDER — IOPAMIDOL (ISOVUE-300) INJECTION 61%
100.0000 mL | Freq: Once | INTRAVENOUS | Status: AC | PRN
  Administered 2021-11-21: 100 mL via INTRAVENOUS

## 2021-11-28 DIAGNOSIS — E785 Hyperlipidemia, unspecified: Secondary | ICD-10-CM | POA: Diagnosis not present

## 2021-11-28 DIAGNOSIS — E039 Hypothyroidism, unspecified: Secondary | ICD-10-CM | POA: Diagnosis not present

## 2021-11-28 DIAGNOSIS — E1169 Type 2 diabetes mellitus with other specified complication: Secondary | ICD-10-CM | POA: Diagnosis not present

## 2021-11-28 DIAGNOSIS — K219 Gastro-esophageal reflux disease without esophagitis: Secondary | ICD-10-CM | POA: Diagnosis not present

## 2021-12-12 DIAGNOSIS — K8020 Calculus of gallbladder without cholecystitis without obstruction: Secondary | ICD-10-CM | POA: Diagnosis not present

## 2022-03-04 DIAGNOSIS — E039 Hypothyroidism, unspecified: Secondary | ICD-10-CM | POA: Diagnosis not present

## 2022-03-04 DIAGNOSIS — I700 Atherosclerosis of aorta: Secondary | ICD-10-CM | POA: Diagnosis not present

## 2022-03-04 DIAGNOSIS — E785 Hyperlipidemia, unspecified: Secondary | ICD-10-CM | POA: Diagnosis not present

## 2022-03-04 DIAGNOSIS — K219 Gastro-esophageal reflux disease without esophagitis: Secondary | ICD-10-CM | POA: Diagnosis not present

## 2022-03-04 DIAGNOSIS — Z794 Long term (current) use of insulin: Secondary | ICD-10-CM | POA: Diagnosis not present

## 2022-03-04 DIAGNOSIS — F419 Anxiety disorder, unspecified: Secondary | ICD-10-CM | POA: Diagnosis not present

## 2022-03-04 DIAGNOSIS — E1169 Type 2 diabetes mellitus with other specified complication: Secondary | ICD-10-CM | POA: Diagnosis not present

## 2022-03-18 DIAGNOSIS — E119 Type 2 diabetes mellitus without complications: Secondary | ICD-10-CM | POA: Diagnosis not present

## 2022-04-06 DIAGNOSIS — E785 Hyperlipidemia, unspecified: Secondary | ICD-10-CM | POA: Diagnosis not present

## 2022-04-06 DIAGNOSIS — K219 Gastro-esophageal reflux disease without esophagitis: Secondary | ICD-10-CM | POA: Diagnosis not present

## 2022-04-06 DIAGNOSIS — E1169 Type 2 diabetes mellitus with other specified complication: Secondary | ICD-10-CM | POA: Diagnosis not present

## 2022-04-06 DIAGNOSIS — E039 Hypothyroidism, unspecified: Secondary | ICD-10-CM | POA: Diagnosis not present

## 2022-04-22 DIAGNOSIS — E1169 Type 2 diabetes mellitus with other specified complication: Secondary | ICD-10-CM | POA: Diagnosis not present

## 2022-05-06 DIAGNOSIS — E039 Hypothyroidism, unspecified: Secondary | ICD-10-CM | POA: Diagnosis not present

## 2022-05-06 DIAGNOSIS — E785 Hyperlipidemia, unspecified: Secondary | ICD-10-CM | POA: Diagnosis not present

## 2022-05-06 DIAGNOSIS — K219 Gastro-esophageal reflux disease without esophagitis: Secondary | ICD-10-CM | POA: Diagnosis not present

## 2022-05-06 DIAGNOSIS — E1169 Type 2 diabetes mellitus with other specified complication: Secondary | ICD-10-CM | POA: Diagnosis not present

## 2022-05-19 ENCOUNTER — Encounter: Payer: Self-pay | Admitting: Gastroenterology

## 2022-05-19 ENCOUNTER — Other Ambulatory Visit: Payer: Self-pay | Admitting: Gastroenterology

## 2022-05-19 DIAGNOSIS — M549 Dorsalgia, unspecified: Secondary | ICD-10-CM

## 2022-05-20 ENCOUNTER — Encounter (HOSPITAL_BASED_OUTPATIENT_CLINIC_OR_DEPARTMENT_OTHER): Payer: Self-pay

## 2022-05-20 ENCOUNTER — Other Ambulatory Visit: Payer: Self-pay

## 2022-05-20 ENCOUNTER — Emergency Department (HOSPITAL_BASED_OUTPATIENT_CLINIC_OR_DEPARTMENT_OTHER)
Admission: EM | Admit: 2022-05-20 | Discharge: 2022-05-20 | Disposition: A | Discharge status: Home / Self Care | Payer: Medicare HMO | Attending: Emergency Medicine | Admitting: Emergency Medicine

## 2022-05-20 ENCOUNTER — Emergency Department (HOSPITAL_BASED_OUTPATIENT_CLINIC_OR_DEPARTMENT_OTHER): Payer: Medicare HMO | Admitting: Radiology

## 2022-05-20 ENCOUNTER — Ambulatory Visit
Admission: RE | Admit: 2022-05-20 | Discharge: 2022-05-20 | Disposition: A | Discharge status: Home / Self Care | Payer: Medicare HMO | Source: Ambulatory Visit | Attending: Gastroenterology | Admitting: Gastroenterology

## 2022-05-20 DIAGNOSIS — Z7984 Long term (current) use of oral hypoglycemic drugs: Secondary | ICD-10-CM | POA: Diagnosis not present

## 2022-05-20 DIAGNOSIS — S29012A Strain of muscle and tendon of back wall of thorax, initial encounter: Secondary | ICD-10-CM | POA: Diagnosis not present

## 2022-05-20 DIAGNOSIS — M5136 Other intervertebral disc degeneration, lumbar region: Secondary | ICD-10-CM | POA: Diagnosis not present

## 2022-05-20 DIAGNOSIS — S29019A Strain of muscle and tendon of unspecified wall of thorax, initial encounter: Secondary | ICD-10-CM

## 2022-05-20 DIAGNOSIS — E119 Type 2 diabetes mellitus without complications: Secondary | ICD-10-CM | POA: Diagnosis not present

## 2022-05-20 DIAGNOSIS — R140 Abdominal distension (gaseous): Secondary | ICD-10-CM | POA: Diagnosis not present

## 2022-05-20 DIAGNOSIS — M545 Low back pain, unspecified: Secondary | ICD-10-CM | POA: Diagnosis not present

## 2022-05-20 DIAGNOSIS — M549 Dorsalgia, unspecified: Secondary | ICD-10-CM

## 2022-05-20 DIAGNOSIS — M546 Pain in thoracic spine: Secondary | ICD-10-CM

## 2022-05-20 DIAGNOSIS — R197 Diarrhea, unspecified: Secondary | ICD-10-CM | POA: Diagnosis not present

## 2022-05-20 DIAGNOSIS — K8020 Calculus of gallbladder without cholecystitis without obstruction: Secondary | ICD-10-CM | POA: Diagnosis not present

## 2022-05-20 DIAGNOSIS — K5730 Diverticulosis of large intestine without perforation or abscess without bleeding: Secondary | ICD-10-CM | POA: Diagnosis not present

## 2022-05-20 DIAGNOSIS — M4316 Spondylolisthesis, lumbar region: Secondary | ICD-10-CM | POA: Diagnosis not present

## 2022-05-20 LAB — URINALYSIS, ROUTINE W REFLEX MICROSCOPIC
Bilirubin Urine: NEGATIVE
Glucose, UA: NEGATIVE mg/dL
Hgb urine dipstick: NEGATIVE
Ketones, ur: NEGATIVE mg/dL
Leukocytes,Ua: NEGATIVE
Nitrite: NEGATIVE
Specific Gravity, Urine: >1.046 — ABNORMAL HIGH (ref 1.005–1.030)
pH: 5.5 (ref 5.0–8.0)

## 2022-05-20 MED ORDER — LIDOCAINE 5 % EX PTCH: 1.0000 | MEDICATED_PATCH | CUTANEOUS | 0 refills | Status: AC

## 2022-05-20 MED ORDER — LIDOCAINE 5 % EX PTCH
1.0000 | MEDICATED_PATCH | CUTANEOUS | Status: DC
  Administered 2022-05-20: 1 via TRANSDERMAL
  Filled 2022-05-20: qty 1

## 2022-05-20 MED ORDER — IOPAMIDOL (ISOVUE-300) INJECTION 61%: 100.0000 mL | Freq: Once | INTRAVENOUS | Status: DC | PRN

## 2022-05-20 MED ORDER — IOPAMIDOL (ISOVUE-300) INJECTION 61%
100.0000 mL | Freq: Once | INTRAVENOUS | Status: AC | PRN
  Administered 2022-05-20: 100 mL via INTRAVENOUS

## 2022-05-20 MED ORDER — IBUPROFEN 400 MG PO TABS
600.0000 mg | ORAL_TABLET | Freq: Once | ORAL | Status: AC
  Administered 2022-05-20: 600 mg via ORAL
  Filled 2022-05-20: qty 1

## 2022-05-20 MED ORDER — OXYCODONE-ACETAMINOPHEN 5-325 MG PO TABS: 1.0000 | ORAL_TABLET | Freq: Four times a day (QID) | ORAL | 0 refills | Status: AC | PRN

## 2022-05-20 MED ORDER — OXYCODONE-ACETAMINOPHEN 5-325 MG PO TABS
1.0000 | ORAL_TABLET | Freq: Once | ORAL | Status: AC
  Administered 2022-05-20: 1 via ORAL
  Filled 2022-05-20: qty 1

## 2022-05-20 NOTE — ED Provider Notes (Addendum)
[facility]ENC[facility] DEPT Provider Note   CSN: [id number] Arrival date & time: 05/20/22  1423     History  Chief Complaint  Patient presents with   Back Pain    Ivan Flores is a 87 y.o. male.  With PMH of GERD,  HLD, DM, previous spinal surgery who presents with right sided mid back pain.  Patient has been having ongoing back pain issues ongoing for many years but felt like he slept in the wrong way last night and has been having muscle spasm and deep pain in his right mid back.  It is worse with palpation and with twisting and turning movements.  He has been taking Tylenol and ibuprofen with some relief but still having horrible pain at night and difficulty sleeping due to the pain.  He did have some recent falls to the right side but no head trauma or loss consciousness.  He is still ambulatory.  He denies any loss of sensation, weakness numbness or tingling in his legs or extremities.  He is not losing control of his bladder or bowels.  He is urinating and pooping normally.  No fevers or vomiting.  He does have some abdominal distention and abdominal issues have been ongoing for weeks and being worked up by GI and his outpatient PCP.  He had a CT abdomen pelvis with IV contrast performed today to further evaluate for abdominal issues as well.  Denies chest pain or shortness of breath.   Back Pain      Home Medications Prior to Admission medications   Medication Sig Start Date End Date Taking? Authorizing Provider  azelastine (ASTELIN) 0.1 % nasal spray Place 2 sprays into both nostrils 2 (two) times daily. Use in each nostril as directed    [provider]  cetirizine (ZYRTEC) 10 MG tablet Take 10 mg by mouth daily.    [provider]  fenofibrate 160 MG tablet Take 160 mg by mouth daily.    [provider]  fluticasone (FLONASE) 50 MCG/ACT nasal spray Place 2 sprays into the nose daily.    [provider]  gabapentin  (NEURONTIN) 300 MG capsule Take 1 capsule (300 mg total) by mouth at bedtime. 01/18/18   Star Age, MD  glimepiride (AMARYL) 4 MG tablet Take 4 mg by mouth daily with breakfast.    [provider]  ibuprofen (ADVIL,MOTRIN) 200 MG tablet Take 600 mg by mouth every 6 (six) hours as needed for pain.    [provider]  JARDIANCE 10 MG TABS tablet 10 mg daily. 02/26/17   [provider]  pantoprazole (PROTONIX) 40 MG tablet Take 40 mg by mouth daily.    [provider]  sildenafil (VIAGRA) 100 MG tablet Take 100 mg by mouth daily as needed for erectile dysfunction.    [provider]  tiZANidine (ZANAFLEX) 2 MG tablet Take by mouth at bedtime.    [provider]  traMADol (ULTRAM) 50 MG tablet Take by mouth every 6 (six) hours as needed.    [provider]      Allergies    Erythromycin, Hydrocodone bitartrate er, Keflex [cephalexin], Metformin and related, and Pseudoephedrine    Review of Systems   Review of Systems  Musculoskeletal:  Positive for back pain.    Physical Exam Updated Vital Signs BP (!) 169/69   Pulse (!) 44   Temp 98.4 F (36.9 C) (Oral)   Resp 16   Ht '6\' 1"'$  (1.854 m)  Wt 91.6 kg   SpO2 96%   BMI 26.64 kg/m  Physical Exam Constitutional: Alert and oriented.  Slightly uncomfortable but nontoxic eyes: Conjunctivae are normal. ENT      Head: Normocephalic and atraumatic.      Nose: No congestion.      Mouth/Throat: Mucous membranes are moist.      Neck: No stridor.  No midline tenderness step-offs or deformities of C/T/L-spine.  There are old well-healed scars over the L-spine. Cardiovascular: S1, S2, equal palpable radial pulses.Warm and well perfused. Respiratory: Normal respiratory effort. Breath sounds are normal.  O2 sat 96 on RA Gastrointestinal: Soft and mildly distended with mild epigastrium tenderness no rebound or guarding.  No CVA tenderness Musculoskeletal: Normal range of motion in all  extremities.  Right-sided thoracic paraspinal tenderness to palpation there is no external evidence of trauma, no hematoma, no ecchymoses, no edema.  No midline tenderness step-offs or deformities of C/T/L-spine.  There are old well-healed scars over the L-spine.      Right lower leg: No tenderness or edema.      Left lower leg: No tenderness or edema. Neurologic: Normal speech and language. No gross focal neurologic deficits are appreciated. Skin: Skin is warm, dry and intact. No rash noted. Psychiatric: Mood and affect are normal. Speech and behavior are normal.  ED Results / Procedures / Treatments   Labs (all labs ordered are listed, but only abnormal results are displayed) Labs Reviewed  URINALYSIS, ROUTINE W REFLEX MICROSCOPIC - Abnormal; Notable for the following components:      Result Value   Specific Gravity, Urine >1.046 (*)    Protein, ur TRACE (*)    All other components within normal limits    EKG None  Radiology DG Lumbar Spine 2-3 Views  Result Date: 05/20/2022 CLINICAL DATA:  Back pain EXAM: LUMBAR SPINE - 2-3 VIEW COMPARISON:  02/14/2019 FINDINGS: There is no evidence of lumbar spine fracture. Degenerative facet arthropathy is most pronounced at L4-5 where there is grade 1 anterolisthesis. Mild disc space narrowing at L4-5. Abdominal aortic atherosclerosis. IMPRESSION: No acute findings. Degenerative changes of the lumbar spine, most pronounced at L4-5 where there is grade 1 anterolisthesis. Electronically Signed   By: Davina Poke D.O.   On: 05/20/2022 18:25   CT ABDOMEN PELVIS W CONTRAST  Result Date: 05/20/2022 CLINICAL DATA:  One year history of intermittent right sided back pain with recent worsening associated with nausea, diarrhea, and bloating EXAM: CT ABDOMEN AND PELVIS WITH CONTRAST TECHNIQUE: Multidetector CT imaging of the abdomen and pelvis was performed using the standard protocol following bolus administration of intravenous contrast. RADIATION DOSE  REDUCTION: This exam was performed according to the departmental dose-optimization program which includes automated exposure control, adjustment of the mA and/or kV according to patient size and/or use of iterative reconstruction technique. CONTRAST:  163m ISOVUE-300 IOPAMIDOL (ISOVUE-300) INJECTION 61% COMPARISON:  CT abdomen and pelvis dated 11/21/2021 FINDINGS: Lower chest: 4 mm subpleural right lower lobe nodule (5:3) is unchanged compared to 11/21/2021 no pleural effusion or pneumothorax demonstrated. Partially imaged heart size is normal. Hepatobiliary: No focal hepatic lesions. No intra or extrahepatic biliary ductal dilation. Cholelithiasis. Pancreas: No focal lesions or main ductal dilation. Spleen: Normal in size without focal abnormality. Adrenals/Urinary Tract: No adrenal nodules. No suspicious renal mass, calculi or hydronephrosis. No focal bladder wall thickening. Stomach/Bowel: Normal appearance of the stomach. No evidence of bowel wall thickening, distention, or inflammatory changes. Sigmoid diverticulosis without acute diverticulitis. Appendix is not discretely seens. Vascular/Lymphatic:  Aortic atherosclerosis. No enlarged abdominal or pelvic lymph nodes. Reproductive: Prostate is unremarkable. Other: No free fluid, fluid collection, or free air. Musculoskeletal: No acute or abnormal lytic or blastic osseous lesions. Grade 1 anterolisthesis at L4-5. IMPRESSION: 1. No acute abnormality in the abdomen or pelvis. 2. Cholelithiasis without evidence of acute cholecystitis. 3. Sigmoid diverticulosis without acute diverticulitis. 4. A 4 mm subpleural right lower lobe nodule is unchanged compared to 11/21/2021. No follow-up needed if patient is low-risk.This recommendation follows the consensus statement: Guidelines for Management of Incidental Pulmonary Nodules Detected on CT Images: From the Fleischner Society 2017; Radiology 2017; 284:[id number]. 5.  Aortic Atherosclerosis (ICD10-I70.0). Electronically  Signed   By: Darrin Nipper M.D.   On: 05/20/2022 14:20    Procedures Procedures    Medications Ordered in ED Medications  oxyCODONE-acetaminophen (PERCOCET/ROXICET) 5-325 MG per tablet 1 tablet (has no administration in time range)  lidocaine (LIDODERM) 5 % 1 patch (has no administration in time range)  ibuprofen (ADVIL) tablet 600 mg (has no administration in time range)    ED Course/ Medical Decision Making/ A&P                           Medical Decision Making Ivan Flores is a 87 y.o. male.  With PMH of GERD,  HLD, DM, previous spinal surgery who presents with right sided mid back pain.   Patient has palpable localizing right-sided mid thoracic paraspinal tenderness with no external evidence of injury.  His pain seems most consistent with nonspecific musculoskeletal strain or spasm.  Of note he had a CT abdomen pelvis performed today which I personally reviewed which showed no acute intra-abdominal pathology causing pain.  He does have evidence of cholelithiasis without cholecystitis however he has no right upper quadrant pain on exam he has no fevers, no vomiting or symptoms suggest of of acute cholecystitis or symptomatic cholelithiasis at this time.  I am not concerned for atypical ACS based off localizing pain and no associated cardiopulmonary symptoms.  I am not concerned for cauda equina or fracture additionally there was no evidence of acute fracture on his L-spine imaging today or his CT abdomen pelvis which was also performed today.  Will treat with ibuprofen, Percocet and Lidoderm patch and advise close follow-up with his PCP for referral to PT and strict return precautions.  Will give short course of percocet for as needed severe pain at home but also discussed the importance of taking while resting as it can make him extremely drowsy as well as lidoderm patches.  He is in agreement with plan and safe for discharge.  Amount and/or Complexity of Data Reviewed Radiology:  ordered.    Details: CTAP with contrast 05/20/22 today: "IMPRESSION: 1. No acute abnormality in the abdomen or pelvis. 2. Cholelithiasis without evidence of acute cholecystitis. 3. Sigmoid diverticulosis without acute diverticulitis. 4. A 4 mm subpleural right lower lobe nodule is unchanged compared to 11/21/2021. No follow-up needed if patient is low-risk.This recommendation follows the consensus statement: Guidelines for Management of Incidental Pulmonary Nodules Detected on CT Images: From the Fleischner Society 2017; Radiology 2017; [id number]. 5.  Aortic Atherosclerosis (ICD10-I70.0)."  Risk Prescription drug management.      Final Clinical Impression(s) / ED Diagnoses Final diagnoses:  None    Rx / DC Orders ED Discharge Orders     None         Elgie Congo, MD 05/20/22 [place], [place] C,  MD 05/20/22 1925

## 2022-05-20 NOTE — Discharge Instructions (Addendum)
Your imaging was reassuring and showed no concerning findings requiring further workup or admission to hospital today.  There is no evidence of acute fracture or abdominal abnormality that is likely causing your pain.  We suspect you have a musculoskeletal strain or spasm causing your pain.  Take Tylenol 650 mg every 6-8 hours, ibuprofen 400 mg every 4-6 hours and use the lidocaine patches for pain.  If is still severe you can take a Percocet as needed.  Do not take while drinking alcohol or driving a car.  This medication can make you very drowsy.  Follow-up with your doctor regarding your visit to the ER today.  Get a referral to physical therapy.  Come back for any severe uncontrolled worsening pain, chest pain, fainting, loss of sensation in the legs, inability to walk, or any other symptoms concerning to you.

## 2022-05-20 NOTE — ED Triage Notes (Signed)
Patient here POV from Home.  Endorses Back Pain Right Lower Back Pain that began approximately 1 Week ago. Spasm-Like. Eased some yesterday but progressively became worse since.   NAD Noted during Triage. A&Ox4. GCS 15. Ambulatory.

## 2022-05-27 DIAGNOSIS — E1169 Type 2 diabetes mellitus with other specified complication: Secondary | ICD-10-CM | POA: Diagnosis not present

## 2022-05-29 DIAGNOSIS — E039 Hypothyroidism, unspecified: Secondary | ICD-10-CM | POA: Diagnosis not present

## 2022-05-29 DIAGNOSIS — E785 Hyperlipidemia, unspecified: Secondary | ICD-10-CM | POA: Diagnosis not present

## 2022-05-29 DIAGNOSIS — E1169 Type 2 diabetes mellitus with other specified complication: Secondary | ICD-10-CM | POA: Diagnosis not present

## 2022-06-27 DIAGNOSIS — E1169 Type 2 diabetes mellitus with other specified complication: Secondary | ICD-10-CM | POA: Diagnosis not present

## 2022-07-28 DIAGNOSIS — E1169 Type 2 diabetes mellitus with other specified complication: Secondary | ICD-10-CM | POA: Diagnosis not present

## 2022-08-04 DIAGNOSIS — E039 Hypothyroidism, unspecified: Secondary | ICD-10-CM | POA: Diagnosis not present

## 2022-08-04 DIAGNOSIS — N529 Male erectile dysfunction, unspecified: Secondary | ICD-10-CM | POA: Diagnosis not present

## 2022-08-04 DIAGNOSIS — I700 Atherosclerosis of aorta: Secondary | ICD-10-CM | POA: Diagnosis not present

## 2022-08-04 DIAGNOSIS — K219 Gastro-esophageal reflux disease without esophagitis: Secondary | ICD-10-CM | POA: Diagnosis not present

## 2022-08-04 DIAGNOSIS — K580 Irritable bowel syndrome with diarrhea: Secondary | ICD-10-CM | POA: Diagnosis not present

## 2022-08-04 DIAGNOSIS — E1169 Type 2 diabetes mellitus with other specified complication: Secondary | ICD-10-CM | POA: Diagnosis not present

## 2022-08-04 DIAGNOSIS — Z79899 Other long term (current) drug therapy: Secondary | ICD-10-CM | POA: Diagnosis not present

## 2022-08-04 DIAGNOSIS — Z23 Encounter for immunization: Secondary | ICD-10-CM | POA: Diagnosis not present

## 2022-08-04 DIAGNOSIS — Z0000 Encounter for general adult medical examination without abnormal findings: Secondary | ICD-10-CM | POA: Diagnosis not present

## 2022-08-04 DIAGNOSIS — F419 Anxiety disorder, unspecified: Secondary | ICD-10-CM | POA: Diagnosis not present

## 2022-08-05 ENCOUNTER — Other Ambulatory Visit: Payer: Self-pay | Admitting: Family Medicine

## 2022-08-05 ENCOUNTER — Ambulatory Visit
Admission: RE | Admit: 2022-08-05 | Discharge: 2022-08-05 | Disposition: A | Discharge status: Home / Self Care | Payer: Medicare HMO | Source: Ambulatory Visit | Attending: Family Medicine | Admitting: Family Medicine

## 2022-08-05 DIAGNOSIS — R600 Localized edema: Secondary | ICD-10-CM | POA: Diagnosis not present

## 2022-08-05 DIAGNOSIS — M25571 Pain in right ankle and joints of right foot: Secondary | ICD-10-CM

## 2022-08-20 ENCOUNTER — Other Ambulatory Visit: Payer: Self-pay

## 2022-08-20 ENCOUNTER — Other Ambulatory Visit (HOSPITAL_COMMUNITY): Payer: Self-pay | Admitting: Sports Medicine

## 2022-08-20 ENCOUNTER — Ambulatory Visit (HOSPITAL_COMMUNITY)
Admission: RE | Admit: 2022-08-20 | Discharge: 2022-08-20 | Disposition: A | Discharge status: Home / Self Care | Payer: Medicare HMO | Source: Ambulatory Visit | Attending: Vascular Surgery | Admitting: Vascular Surgery

## 2022-08-20 DIAGNOSIS — M25571 Pain in right ankle and joints of right foot: Secondary | ICD-10-CM | POA: Diagnosis not present

## 2022-08-20 DIAGNOSIS — M7989 Other specified soft tissue disorders: Secondary | ICD-10-CM | POA: Diagnosis not present

## 2022-08-28 DIAGNOSIS — E1169 Type 2 diabetes mellitus with other specified complication: Secondary | ICD-10-CM | POA: Diagnosis not present

## 2022-09-02 DIAGNOSIS — M25571 Pain in right ankle and joints of right foot: Secondary | ICD-10-CM | POA: Diagnosis not present

## 2022-09-03 ENCOUNTER — Other Ambulatory Visit: Payer: Self-pay | Admitting: Sports Medicine

## 2022-09-03 DIAGNOSIS — M25571 Pain in right ankle and joints of right foot: Secondary | ICD-10-CM

## 2022-09-14 ENCOUNTER — Ambulatory Visit
Admission: RE | Admit: 2022-09-14 | Discharge: 2022-09-14 | Disposition: A | Discharge status: Home / Self Care | Payer: Medicare HMO | Source: Ambulatory Visit | Attending: Sports Medicine | Admitting: Sports Medicine

## 2022-09-14 DIAGNOSIS — M25571 Pain in right ankle and joints of right foot: Secondary | ICD-10-CM

## 2022-09-14 DIAGNOSIS — R600 Localized edema: Secondary | ICD-10-CM | POA: Diagnosis not present

## 2022-09-14 DIAGNOSIS — M25471 Effusion, right ankle: Secondary | ICD-10-CM | POA: Diagnosis not present

## 2022-09-28 DIAGNOSIS — E1169 Type 2 diabetes mellitus with other specified complication: Secondary | ICD-10-CM | POA: Diagnosis not present

## 2022-10-29 DIAGNOSIS — E1169 Type 2 diabetes mellitus with other specified complication: Secondary | ICD-10-CM | POA: Diagnosis not present

## 2022-11-27 DIAGNOSIS — D430 Neoplasm of uncertain behavior of brain, supratentorial: Secondary | ICD-10-CM | POA: Diagnosis not present

## 2022-11-27 DIAGNOSIS — K090 Developmental odontogenic cysts: Secondary | ICD-10-CM | POA: Diagnosis not present

## 2022-11-27 DIAGNOSIS — D480 Neoplasm of uncertain behavior of bone and articular cartilage: Secondary | ICD-10-CM | POA: Diagnosis not present

## 2022-11-29 DIAGNOSIS — E1169 Type 2 diabetes mellitus with other specified complication: Secondary | ICD-10-CM | POA: Diagnosis not present

## 2022-12-29 DIAGNOSIS — E039 Hypothyroidism, unspecified: Secondary | ICD-10-CM | POA: Diagnosis not present

## 2022-12-29 DIAGNOSIS — I700 Atherosclerosis of aorta: Secondary | ICD-10-CM | POA: Diagnosis not present

## 2022-12-29 DIAGNOSIS — E119 Type 2 diabetes mellitus without complications: Secondary | ICD-10-CM | POA: Diagnosis not present

## 2022-12-29 DIAGNOSIS — R197 Diarrhea, unspecified: Secondary | ICD-10-CM | POA: Diagnosis not present

## 2022-12-29 DIAGNOSIS — M25571 Pain in right ankle and joints of right foot: Secondary | ICD-10-CM | POA: Diagnosis not present

## 2022-12-29 DIAGNOSIS — E1169 Type 2 diabetes mellitus with other specified complication: Secondary | ICD-10-CM | POA: Diagnosis not present

## 2022-12-29 DIAGNOSIS — E785 Hyperlipidemia, unspecified: Secondary | ICD-10-CM | POA: Diagnosis not present

## 2022-12-29 DIAGNOSIS — N529 Male erectile dysfunction, unspecified: Secondary | ICD-10-CM | POA: Diagnosis not present

## 2022-12-29 DIAGNOSIS — F419 Anxiety disorder, unspecified: Secondary | ICD-10-CM | POA: Diagnosis not present

## 2022-12-29 DIAGNOSIS — M5481 Occipital neuralgia: Secondary | ICD-10-CM | POA: Diagnosis not present

## 2023-01-05 DIAGNOSIS — E1169 Type 2 diabetes mellitus with other specified complication: Secondary | ICD-10-CM | POA: Diagnosis not present

## 2023-02-05 DIAGNOSIS — E1169 Type 2 diabetes mellitus with other specified complication: Secondary | ICD-10-CM | POA: Diagnosis not present

## 2023-04-08 DIAGNOSIS — E1169 Type 2 diabetes mellitus with other specified complication: Secondary | ICD-10-CM | POA: Diagnosis not present

## 2023-05-04 DIAGNOSIS — E119 Type 2 diabetes mellitus without complications: Secondary | ICD-10-CM | POA: Diagnosis not present

## 2023-05-09 DIAGNOSIS — E1169 Type 2 diabetes mellitus with other specified complication: Secondary | ICD-10-CM | POA: Diagnosis not present

## 2023-06-09 DIAGNOSIS — E1169 Type 2 diabetes mellitus with other specified complication: Secondary | ICD-10-CM | POA: Diagnosis not present

## 2023-07-10 DIAGNOSIS — E1169 Type 2 diabetes mellitus with other specified complication: Secondary | ICD-10-CM | POA: Diagnosis not present

## 2023-08-10 DIAGNOSIS — E1169 Type 2 diabetes mellitus with other specified complication: Secondary | ICD-10-CM | POA: Diagnosis not present

## 2023-08-12 DIAGNOSIS — E1169 Type 2 diabetes mellitus with other specified complication: Secondary | ICD-10-CM | POA: Diagnosis not present

## 2023-08-12 DIAGNOSIS — E039 Hypothyroidism, unspecified: Secondary | ICD-10-CM | POA: Diagnosis not present

## 2023-08-12 DIAGNOSIS — F419 Anxiety disorder, unspecified: Secondary | ICD-10-CM | POA: Diagnosis not present

## 2023-08-12 DIAGNOSIS — Z79899 Other long term (current) drug therapy: Secondary | ICD-10-CM | POA: Diagnosis not present

## 2023-08-12 DIAGNOSIS — Z0000 Encounter for general adult medical examination without abnormal findings: Secondary | ICD-10-CM | POA: Diagnosis not present

## 2023-08-12 DIAGNOSIS — M5481 Occipital neuralgia: Secondary | ICD-10-CM | POA: Diagnosis not present

## 2023-08-12 DIAGNOSIS — E785 Hyperlipidemia, unspecified: Secondary | ICD-10-CM | POA: Diagnosis not present

## 2023-08-12 DIAGNOSIS — K219 Gastro-esophageal reflux disease without esophagitis: Secondary | ICD-10-CM | POA: Diagnosis not present

## 2023-08-12 DIAGNOSIS — I700 Atherosclerosis of aorta: Secondary | ICD-10-CM | POA: Diagnosis not present

## 2023-08-12 DIAGNOSIS — K580 Irritable bowel syndrome with diarrhea: Secondary | ICD-10-CM | POA: Diagnosis not present

## 2023-09-10 DIAGNOSIS — E1169 Type 2 diabetes mellitus with other specified complication: Secondary | ICD-10-CM | POA: Diagnosis not present

## 2023-10-11 DIAGNOSIS — E1169 Type 2 diabetes mellitus with other specified complication: Secondary | ICD-10-CM | POA: Diagnosis not present

## 2023-11-11 DIAGNOSIS — E1169 Type 2 diabetes mellitus with other specified complication: Secondary | ICD-10-CM | POA: Diagnosis not present

## 2023-12-12 DIAGNOSIS — E1169 Type 2 diabetes mellitus with other specified complication: Secondary | ICD-10-CM | POA: Diagnosis not present

## 2024-01-12 DIAGNOSIS — E1169 Type 2 diabetes mellitus with other specified complication: Secondary | ICD-10-CM | POA: Diagnosis not present

## 2024-02-09 DIAGNOSIS — E1169 Type 2 diabetes mellitus with other specified complication: Secondary | ICD-10-CM | POA: Diagnosis not present

## 2024-02-09 DIAGNOSIS — M6281 Muscle weakness (generalized): Secondary | ICD-10-CM | POA: Diagnosis not present

## 2024-02-09 DIAGNOSIS — F419 Anxiety disorder, unspecified: Secondary | ICD-10-CM | POA: Diagnosis not present

## 2024-02-09 DIAGNOSIS — K219 Gastro-esophageal reflux disease without esophagitis: Secondary | ICD-10-CM | POA: Diagnosis not present

## 2024-02-09 DIAGNOSIS — E785 Hyperlipidemia, unspecified: Secondary | ICD-10-CM | POA: Diagnosis not present

## 2024-02-09 DIAGNOSIS — M5481 Occipital neuralgia: Secondary | ICD-10-CM | POA: Diagnosis not present

## 2024-02-09 DIAGNOSIS — I700 Atherosclerosis of aorta: Secondary | ICD-10-CM | POA: Diagnosis not present

## 2024-02-09 DIAGNOSIS — R197 Diarrhea, unspecified: Secondary | ICD-10-CM | POA: Diagnosis not present

## 2024-02-09 DIAGNOSIS — J309 Allergic rhinitis, unspecified: Secondary | ICD-10-CM | POA: Diagnosis not present

## 2024-02-12 DIAGNOSIS — E1169 Type 2 diabetes mellitus with other specified complication: Secondary | ICD-10-CM | POA: Diagnosis not present

## 2024-03-14 DIAGNOSIS — E1169 Type 2 diabetes mellitus with other specified complication: Secondary | ICD-10-CM | POA: Diagnosis not present

## 2024-03-30 DIAGNOSIS — R296 Repeated falls: Secondary | ICD-10-CM | POA: Diagnosis not present

## 2024-03-30 DIAGNOSIS — S60211A Contusion of right wrist, initial encounter: Secondary | ICD-10-CM | POA: Diagnosis not present

## 2024-03-30 DIAGNOSIS — S20212A Contusion of left front wall of thorax, initial encounter: Secondary | ICD-10-CM | POA: Diagnosis not present

## 2024-03-30 DIAGNOSIS — S20229A Contusion of unspecified back wall of thorax, initial encounter: Secondary | ICD-10-CM | POA: Diagnosis not present

## 2024-03-30 DIAGNOSIS — S0033XA Contusion of nose, initial encounter: Secondary | ICD-10-CM | POA: Diagnosis not present

## 2024-04-17 DIAGNOSIS — E1169 Type 2 diabetes mellitus with other specified complication: Secondary | ICD-10-CM | POA: Diagnosis not present

## 2024-05-15 DIAGNOSIS — K219 Gastro-esophageal reflux disease without esophagitis: Secondary | ICD-10-CM | POA: Diagnosis not present

## 2024-05-15 DIAGNOSIS — M5481 Occipital neuralgia: Secondary | ICD-10-CM | POA: Diagnosis not present

## 2024-05-15 DIAGNOSIS — R1319 Other dysphagia: Secondary | ICD-10-CM | POA: Diagnosis not present

## 2024-05-15 DIAGNOSIS — R197 Diarrhea, unspecified: Secondary | ICD-10-CM | POA: Diagnosis not present

## 2024-05-15 DIAGNOSIS — E1169 Type 2 diabetes mellitus with other specified complication: Secondary | ICD-10-CM | POA: Diagnosis not present

## 2024-05-15 DIAGNOSIS — E785 Hyperlipidemia, unspecified: Secondary | ICD-10-CM | POA: Diagnosis not present

## 2024-05-15 DIAGNOSIS — F419 Anxiety disorder, unspecified: Secondary | ICD-10-CM | POA: Diagnosis not present

## 2024-05-17 DIAGNOSIS — E1169 Type 2 diabetes mellitus with other specified complication: Secondary | ICD-10-CM | POA: Diagnosis not present
